# Patient Record
Sex: Male | Born: 1970 | Race: White | Hispanic: No | Marital: Married | Smoking: Never smoker
Health system: Southern US, Community
[De-identification: ages and names within clinical notes are randomized; demographics above are authoritative.]

## PROBLEM LIST (undated history)

## (undated) DIAGNOSIS — K219 Gastro-esophageal reflux disease without esophagitis: Secondary | ICD-10-CM

---

## 2006-11-03 ENCOUNTER — Emergency Department (HOSPITAL_COMMUNITY): Admission: EM | Admit: 2006-11-03 | Discharge: 2006-11-03 | Payer: Self-pay | Admitting: Emergency Medicine

## 2009-02-04 ENCOUNTER — Emergency Department: Payer: Self-pay | Admitting: Emergency Medicine

## 2010-08-14 IMAGING — CT CT STONE STUDY
1 of 3 series · 13 of 32 positions shown, 19 images · non-contrast
Comparison: none

REASON FOR EXAM: right flank pain
COMMENTS:

PROCEDURE:     CT  - CT ABDOMEN /PELVIS WO (STONE)  - February 04, 2009  [DATE]
RESULT:
HISTORY: RIGHT flank pain.

[Series 2: stone · axial · 0.61mm/px · z∈[-1046,-618]mm · 13 of 167 slices shown, 19 images]
[im 12/167  soft-tissue]
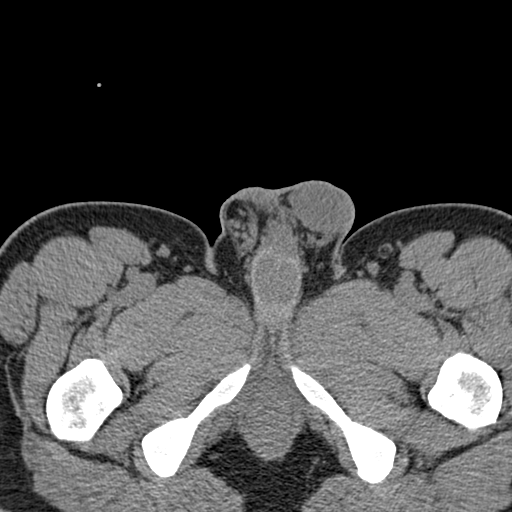
[im 12/167  bone]
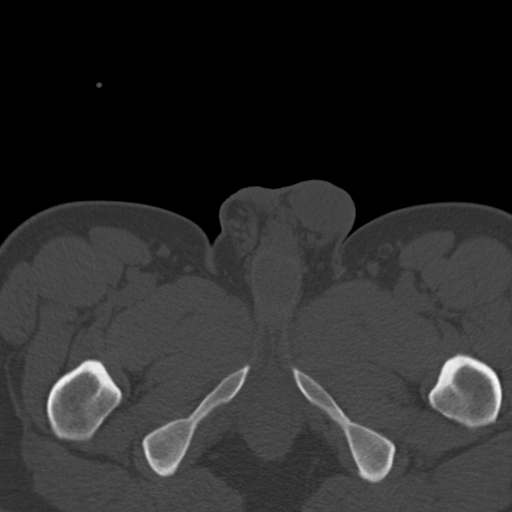
[im 24/167  soft-tissue]
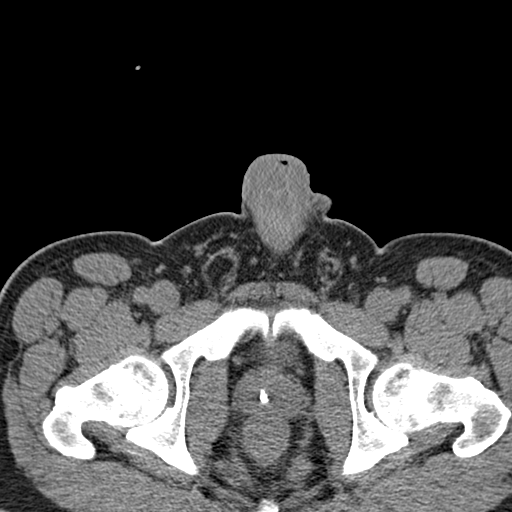
[im 36/167  soft-tissue]
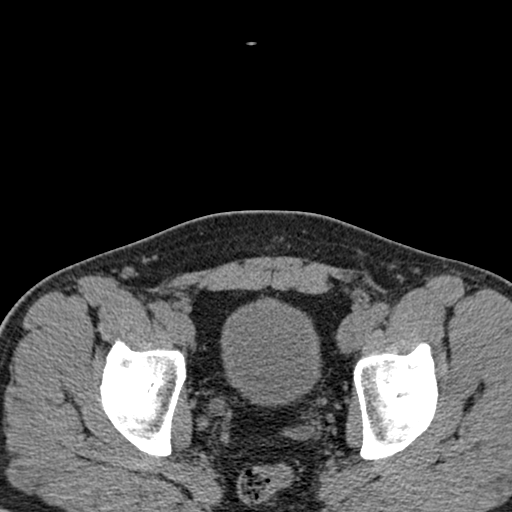
[im 48/167  soft-tissue]
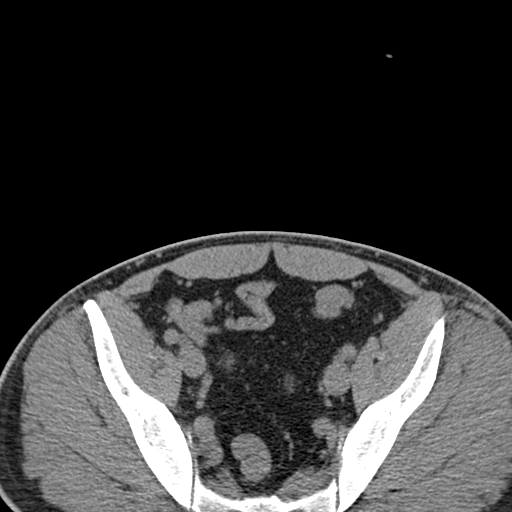
[im 60/167  soft-tissue]
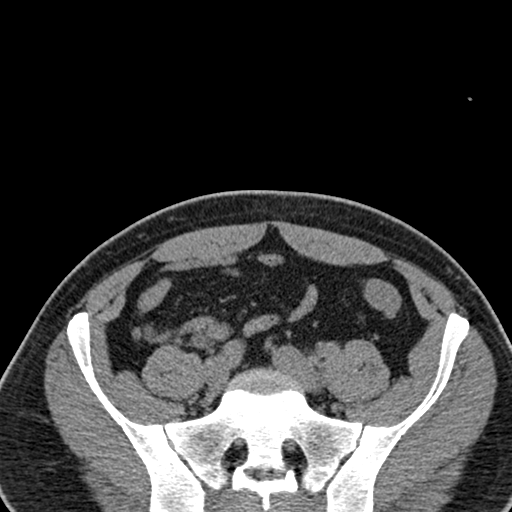
[im 72/167  soft-tissue]
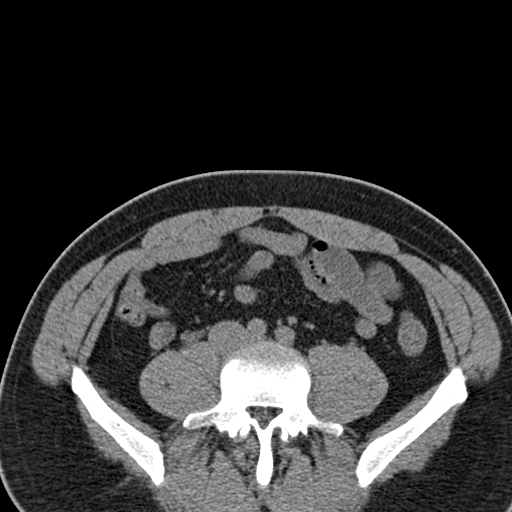
[im 84/167  soft-tissue]
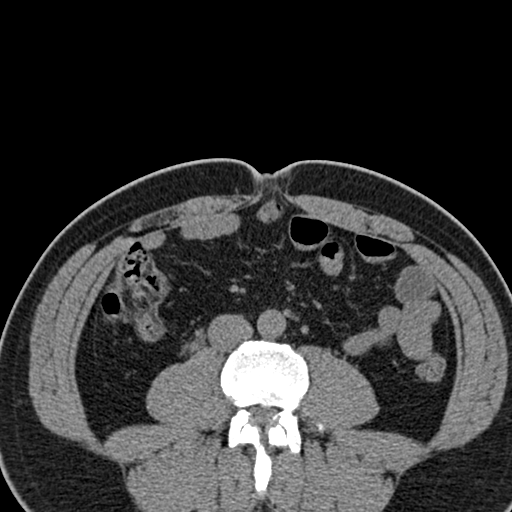
[im 95/167  soft-tissue]
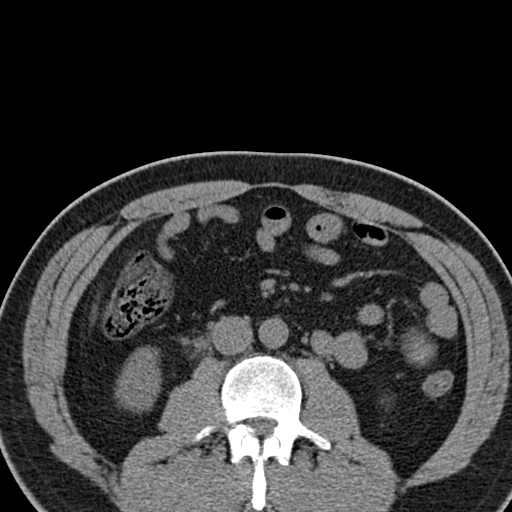
[im 107/167  soft-tissue]
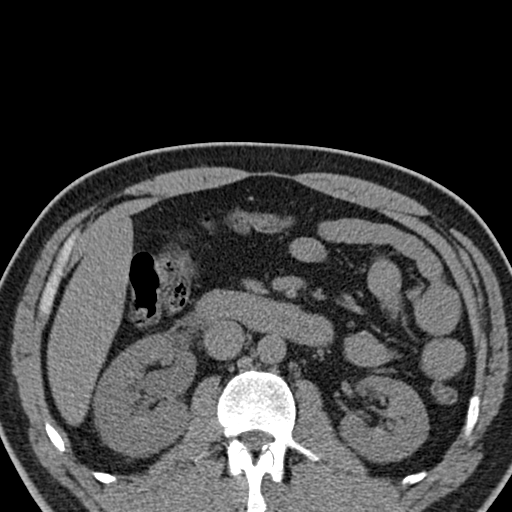
[im 107/167  bone]
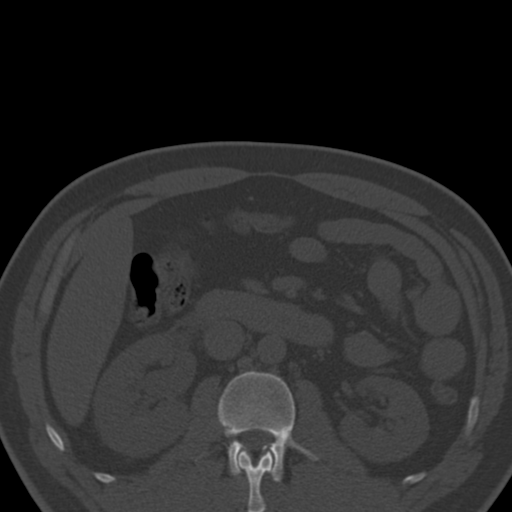
[im 119/167  soft-tissue]
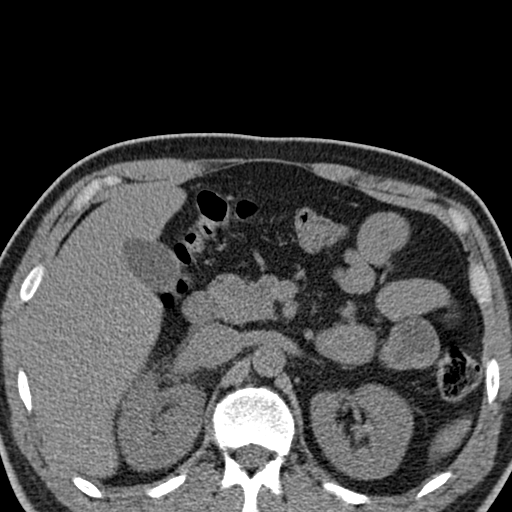
[im 119/167  lung]
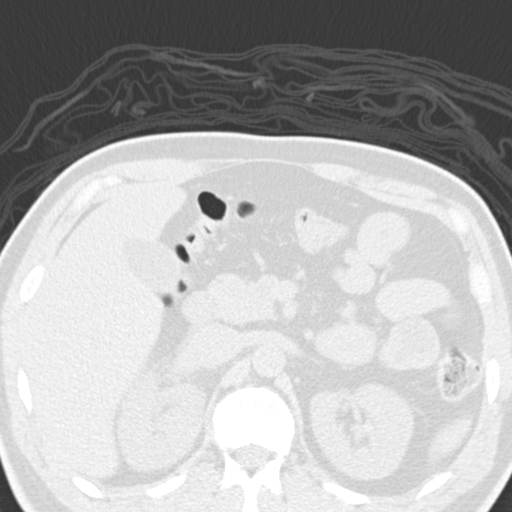
[im 131/167  soft-tissue]
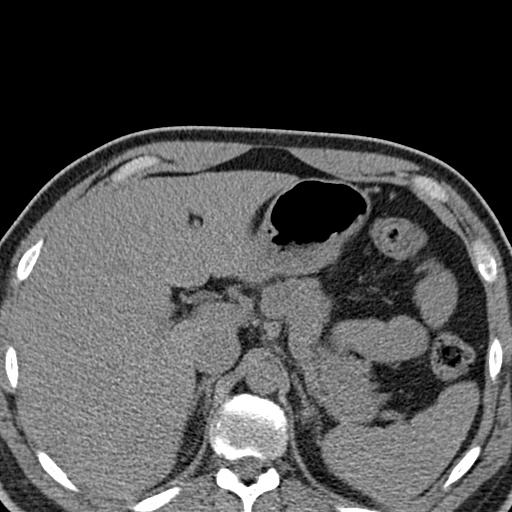
[im 131/167  lung]
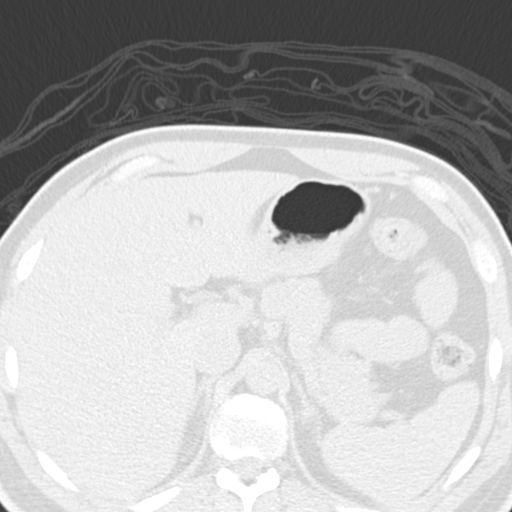
[im 143/167  soft-tissue]
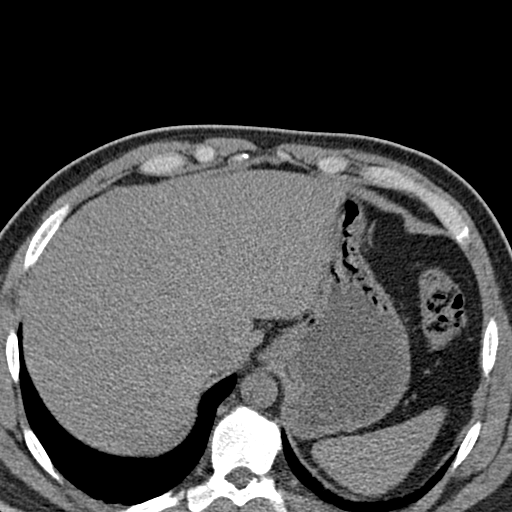
[im 143/167  lung]
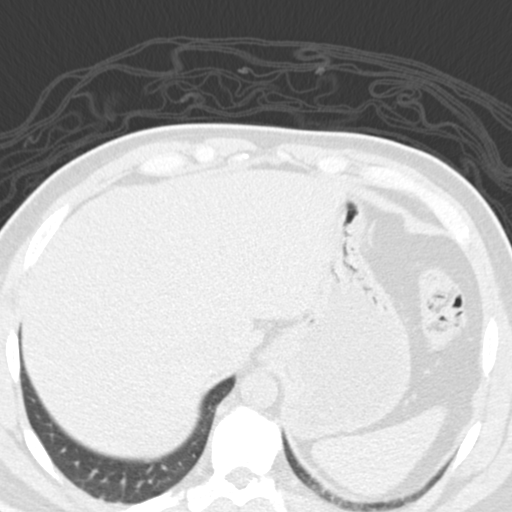
[im 155/167  soft-tissue]
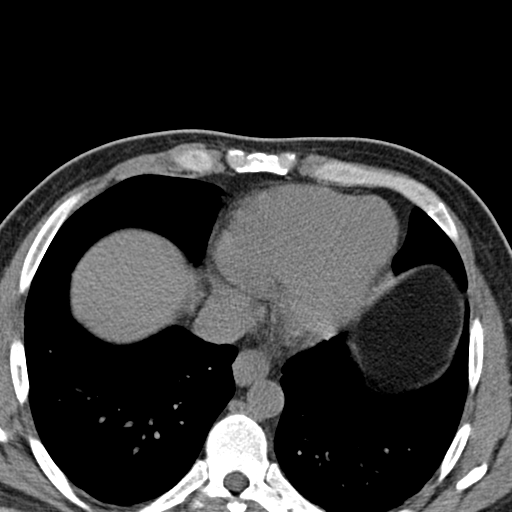
[im 155/167  lung]
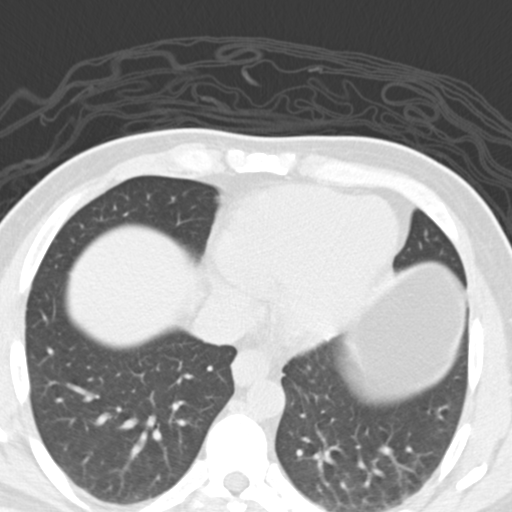

[13 of 32 positions shown; findings below may reference images not displayed]

COMPARISON STUDIES: No recent.

PROCEDURE AND FINDINGS: Non-enhanced CT of the abdomen and pelvis is
obtained. The liver and spleen are normal. The pancreas is normal. The
adrenals are normal. RIGHT nephrolithiasis is present.  There is an
approximately 4 to 5 mm stone in the distal RIGHT ureter with mild RIGHT
hydronephrosis and hydroureter. The bladder is unremarkable. The appendix is
normal. No bowel distention is noted.
IMPRESSION: 1. RIGHT nephrolithiasis with 4 to 5 mm stone in the distal RIGHT ureter
with RIGHT hydronephrosis and hydroureter.

This report was phoned to the Emergency Department physician at the time of
the study.

## 2010-10-11 IMAGING — CR DG ABDOMEN 1V
1 series · 1 of 1 positions shown · non-contrast
Comparison: none

REASON FOR EXAM: nephrolithiasis pt need films
COMMENTS:

[view not recorded]
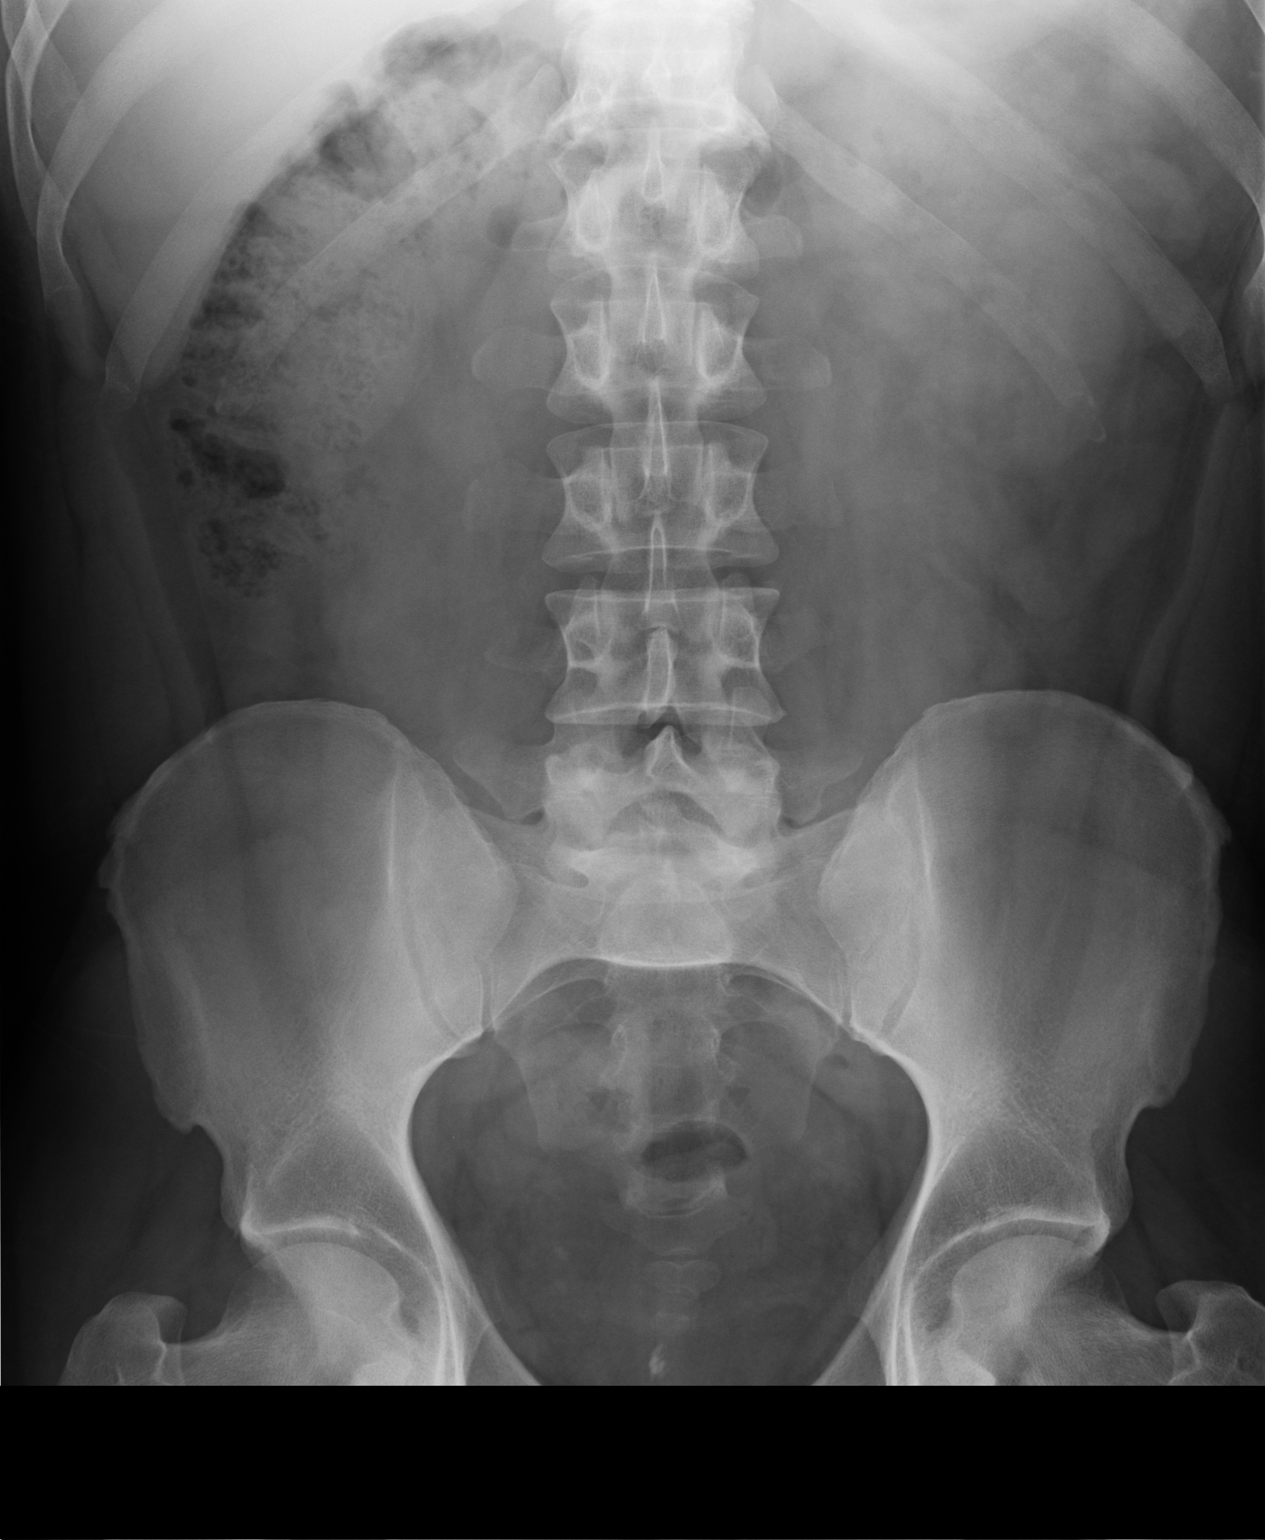

[1 of 1 positions shown; findings below may reference images not displayed]

PROCEDURE:     DXR - DXR KIDNEY URETER BLADDER  - April 03, 2009  [DATE]

RESULT:     There is a 4 mm calcification noted low in the right pelvis. The
etiology for this is not certain but the finding could represent a distal
right ureteral stone. No other calcification suspicious for renal or
ureteral stones is seen. There is a small amount of calcification noted
centrally in the region of the prostate gland.
IMPRESSION: 1. No renal calcifications are identified.
2. There is a faint density low in the right pelvis that could represent a
distal right ureteral stone.
3. There is a focus of calcification centrally in the region of the prostate.

## 2013-02-28 HISTORY — PX: HERNIA REPAIR: SHX51

## 2013-03-21 ENCOUNTER — Encounter: Payer: Self-pay | Admitting: General Surgery

## 2013-03-21 ENCOUNTER — Ambulatory Visit (INDEPENDENT_AMBULATORY_CARE_PROVIDER_SITE_OTHER): Payer: BC Managed Care – PPO | Admitting: General Surgery

## 2013-03-21 VITALS — BP 130/70 | HR 76 | Resp 12 | Ht 68.0 in | Wt 205.0 lb

## 2013-03-21 DIAGNOSIS — K429 Umbilical hernia without obstruction or gangrene: Secondary | ICD-10-CM

## 2013-03-21 NOTE — Patient Instructions (Addendum)
Hernia precautions and incarceration were discussed with the patient. If they develop symptoms of an incarcerated hernia, they were encouraged to seek prompt medical attention.  I have recommended repair of the hernia, using mesh if indicated, on an outpatient basis in the near future. The risk of infection was reviewed. The role of prosthetic mesh to minimize the risk of recurrence was reviewed.  Patient to check work schedule and will call the office when ready to arrange.

## 2013-03-21 NOTE — Progress Notes (Signed)
Patient ID: Matthew Giles, male   DOB: Jan 18, 1971, 42 y.o.   MRN: [id number]  Chief Complaint  Patient presents with  . Umbilical Hernia    HPI Matthew Giles is a 42 y.o. male here today for an evaluation of an umbilical hernia. Patient states it has been there for about four months. Was hurting him last week. HPI  History reviewed. No pertinent past medical history.  History reviewed. No pertinent past surgical history.  History reviewed. No pertinent family history.  Social History History  Substance Use Topics  . Smoking status: Not on file  . Smokeless tobacco: Never Used  . Alcohol Use: No    No Known Allergies  Current Outpatient Prescriptions  Medication Sig Dispense Refill  . omeprazole (PRILOSEC) 20 MG capsule Take 20 mg by mouth daily.       No current facility-administered medications for this visit.    Review of Systems Review of Systems  Constitutional: Negative.   Respiratory: Negative.   Cardiovascular: Positive for chest pain (off and on).    Blood pressure 130/70, pulse 76, resp. rate 12, height 5\' 8"  (1.727 m), weight 205 lb (92.987 kg).  Physical Exam Physical Exam  Constitutional: He appears well-developed and well-nourished.  Neck: Normal range of motion. Neck supple.  Cardiovascular: Normal rate, regular rhythm and normal heart sounds.   Pulmonary/Chest: Effort normal and breath sounds normal.  Abdominal: Soft. Bowel sounds are normal. A hernia (small umbilical hernia  5 mm ) is present.      Data Reviewed none  Assessment    Symptomatic small umbilical hernia     Plan    Recommend repair, l;ikely will not need use of mesh.      Patient to check work schedule and will call the office when ready to arrange.  [name] 03/22/2013, 1:26 PM

## 2013-03-22 ENCOUNTER — Encounter: Payer: Self-pay | Admitting: General Surgery

## 2013-03-22 ENCOUNTER — Telehealth: Payer: Self-pay | Admitting: *Deleted

## 2013-03-22 DIAGNOSIS — K429 Umbilical hernia without obstruction or gangrene: Secondary | ICD-10-CM | POA: Insufficient documentation

## 2013-03-22 NOTE — Telephone Encounter (Signed)
Patient called to arrange surgery. This has been scheduled for 03-24-13 at [facility]. He is aware of all instructions and will call if he has further questions.

## 2013-03-22 NOTE — Addendum Note (Signed)
Addended by: Kieth Brightly on: 03/22/2013 01:40 PM   Modules accepted: Orders

## 2013-03-24 ENCOUNTER — Ambulatory Visit: Payer: Self-pay | Admitting: General Surgery

## 2013-03-24 DIAGNOSIS — K429 Umbilical hernia without obstruction or gangrene: Secondary | ICD-10-CM

## 2013-04-04 ENCOUNTER — Encounter: Payer: Self-pay | Admitting: General Surgery

## 2013-04-04 ENCOUNTER — Ambulatory Visit (INDEPENDENT_AMBULATORY_CARE_PROVIDER_SITE_OTHER): Payer: BC Managed Care – PPO | Admitting: General Surgery

## 2013-04-04 VITALS — BP 104/74 | HR 70 | Resp 14 | Ht 68.0 in | Wt 208.0 lb

## 2013-04-04 DIAGNOSIS — K429 Umbilical hernia without obstruction or gangrene: Secondary | ICD-10-CM

## 2013-04-04 NOTE — Patient Instructions (Addendum)
Patient to return May 12 to work. 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

## 2013-04-04 NOTE — Progress Notes (Signed)
Subjective:     Patient ID: Matthew Giles, male   DOB: May 06, 1971, 42 y.o.   MRN: [id number]  HPI Patient here today for postop visit umbilical hernia done  March 24, 2013.   Still a little sore but states doing well. Denies nausea or vomiting.  Bowels are regular. No mesh was used-he had a 6 mm fascial defect.   Review of Systems  Constitutional: Negative.   Respiratory: Negative.   Cardiovascular: Negative.        Objective:   Physical Exam  Constitutional: He is oriented to person, place, and time. He appears well-developed and well-nourished.  Abdominal: Soft. Bowel sounds are normal.  Hernia site is nice and clean.  Neurological: He is alert and oriented to person, place, and time.       Assessment:     Doing well post op repair of small umbilical hernia     Plan:     May increase activity to normal next week.

## 2013-04-05 ENCOUNTER — Encounter: Payer: Self-pay | Admitting: General Surgery

## 2013-05-10 ENCOUNTER — Ambulatory Visit: Payer: BC Managed Care – PPO | Admitting: General Surgery

## 2013-05-24 ENCOUNTER — Encounter: Payer: Self-pay | Admitting: General Surgery

## 2013-05-24 ENCOUNTER — Ambulatory Visit (INDEPENDENT_AMBULATORY_CARE_PROVIDER_SITE_OTHER): Payer: BC Managed Care – PPO | Admitting: General Surgery

## 2013-05-24 VITALS — BP 110/78 | HR 68 | Resp 12 | Ht 68.0 in | Wt 203.0 lb

## 2013-05-24 DIAGNOSIS — K429 Umbilical hernia without obstruction or gangrene: Secondary | ICD-10-CM

## 2013-05-24 NOTE — Progress Notes (Signed)
Patient ID: Matthew Giles, male   DOB: [birth year], 42 y.o.   MRN: [id number]  Chief Complaint  Patient presents with  . Follow-up    hernia repair    HPI Matthew Giles is a 42 y.o. male who presents for a follow up post op umbilical hernia repair. The procedure was performed on 03/24/13. The patient states he is doing well. No complaints at this time.  HPI  History reviewed. No pertinent past medical history.  Past Surgical History  Procedure Laterality Date  . Hernia repair  April 2014    umbilical Dr Evette Cristal    History reviewed. No pertinent family history.  Social History History  Substance Use Topics  . Smoking status: Never Smoker   . Smokeless tobacco: Never Used  . Alcohol Use: No    No Known Allergies  No current outpatient prescriptions on file.   No current facility-administered medications for this visit.    Review of Systems Review of Systems  Constitutional: Negative.   Respiratory: Negative.   Cardiovascular: Negative.   Gastrointestinal: Negative.     Blood pressure 110/78, pulse 68, resp. rate 12, height 5\' 8"  (1.727 m), weight 203 lb (92.08 kg).  Physical Exam Physical ExamAbd is soft. Umbilical incision is well healed.  Data Reviewed none  Assessment   Umbilical hernia repair looks clean and well healed.    Plan    Patient to return as needed.        SANKAR,SEEPLAPUTHUR G 05/26/2013, 4:54 PM

## 2013-05-24 NOTE — Patient Instructions (Signed)
Patient to return as needed. 

## 2013-05-26 ENCOUNTER — Encounter: Payer: Self-pay | Admitting: General Surgery

## 2014-11-09 ENCOUNTER — Ambulatory Visit: Payer: Self-pay | Admitting: Family Medicine

## 2015-03-22 NOTE — Op Note (Signed)
PATIENT NAME:  Matthew Giles, Matthew Giles MR#:  [id number] DATE OF BIRTH:  30-Mar-1971  DATE OF PROCEDURE:  03/24/2013  PREOPERATIVE DIAGNOSIS: Umbilical hernia.   POSTOPERATIVE DIAGNOSIS: Umbilical hernia.   OPERATION: Repair of umbilical hernia.   SURGEON: Mckinley Jewel, MD   ANESTHESIA: Monitored care with local anesthetic of 0.5% Marcaine mixed with 1% Xylocaine.   COMPLICATIONS: None.   ESTIMATED BLOOD LOSS: Minimal.   DRAINS: None.   DESCRIPTION OF PROCEDURE: The patient was placed in the supine position on the operating table. With adequate sedation and monitoring, the umbilical area was prepped and draped out as a sterile field. Time out procedure was performed. Local anesthetic was then instilled along the upper lip of the umbilicus where the hernia was located and along the sides and inferior aspect to obtain an adequate block. A skin incision was made along the upper lip and deepened through the layers down to expose in the subcutaneous tissue a 2 cm fatty protrusion.  A fascial defect was identified which was less than a centimeter in size. The hernial protrusion was easily pushed back into the preperitoneal space, and the fascial edges were then approximated with 2 stitches of 0 Prolene with satisfactory closure of the defect. After ensuring hemostasis with cautery, 3-0 Vicryl was placed in the deeper tissue, and the skin was closed with subcuticular 4-0 Vicryl covered with Dermabond.       The procedure was well tolerated.  He was subsequently returned to the recovery room in stable condition. ____________________________ S.Robinette Haines, MD sgs:cb D: 03/26/2013 18:07:58 ET T: 03/26/2013 20:10:30 ET JOB#: [id number]  cc: Synthia Innocent. Jamal Collin, MD, <Dictator> [facility] Robinette Haines MD ELECTRONICALLY SIGNED 03/26/2013 21:01

## 2017-06-23 ENCOUNTER — Ambulatory Visit (INDEPENDENT_AMBULATORY_CARE_PROVIDER_SITE_OTHER): Payer: BLUE CROSS/BLUE SHIELD | Admitting: Family Medicine

## 2017-06-23 ENCOUNTER — Encounter: Payer: Self-pay | Admitting: Family Medicine

## 2017-06-23 VITALS — BP 100/60 | HR 77 | Temp 98.1°F | Resp 16 | Ht 68.0 in | Wt 206.0 lb

## 2017-06-23 DIAGNOSIS — Z0000 Encounter for general adult medical examination without abnormal findings: Secondary | ICD-10-CM | POA: Diagnosis not present

## 2017-06-23 DIAGNOSIS — Z6831 Body mass index (BMI) 31.0-31.9, adult: Secondary | ICD-10-CM

## 2017-06-23 NOTE — Progress Notes (Signed)
Patient: Matthew Giles, Male    DOB: 01-Jun-1971, 46 y.o.   MRN: [id number] Visit Date: 06/23/2017  Today's Provider: Lelon Huh, MD   Chief Complaint  Patient presents with  . Annual Exam  . Gastroesophageal Reflux   Subjective:    Annual physical exam Matthew Giles is a 46 y.o. male who presents today for health maintenance and complete physical. He feels well. He reports exercising no-work. He reports he is sleeping well.  ----------------------------------------------------------------   GERD From 11/09/2014-no changes. Well controlled on OTC PPI.   Review of Systems  Social History      He  reports that he has never smoked. He has never used smokeless tobacco. He reports that he does not drink alcohol or use drugs.       Social History   Social History  . Marital status: Married    Spouse name: N/A  . Number of children: N/A  . Years of education: N/A   Social History Main Topics  . Smoking status: Never Smoker  . Smokeless tobacco: Never Used  . Alcohol use No  . Drug use: No  . Sexual activity: Not Asked   Other Topics Concern  . None   Social History Narrative  . None    History reviewed. No pertinent past medical history.   Patient Active Problem List   Diagnosis Date Noted  . Umbilical hernia 12/45/8099    Past Surgical History:  Procedure Laterality Date  . HERNIA REPAIR  April 8338   umbilical Dr Jamal Collin    Family History        No family status information on file.        His family history is not on file.     No Known Allergies   Current Outpatient Prescriptions:  .  esomeprazole (NEXIUM 24HR) 20 MG capsule, Take 20 mg by mouth daily., Disp: , Rfl:    Patient Care Team: Birdie Sons, MD as PCP - General (Family Medicine) Christene Lye, MD (General Surgery)      Objective:   Vitals: BP 100/60 (BP Location: Right Arm, Patient Position: Sitting, Cuff Size: Large)   Pulse 77   Temp 98.1 F (36.7  C) (Oral)   Resp 16   Ht 5\' 8"  (1.727 m)   Wt 206 lb (93.4 kg)   SpO2 97%   BMI 31.32 kg/m    Vitals:   06/23/17 1514  BP: 100/60  Pulse: 77  Resp: 16  Temp: 98.1 F (36.7 C)  TempSrc: Oral  SpO2: 97%  Weight: 206 lb (93.4 kg)  Height: 5\' 8"  (1.727 m)    Wt Readings from Last 3 Encounters:  06/23/17 206 lb (93.4 kg)  05/24/13 203 lb (92.1 kg)  04/04/13 208 lb (94.3 kg)    Physical Exam   General Appearance:    Alert, cooperative, no distress, appears stated age  Head:    Normocephalic, without obvious abnormality, atraumatic  Eyes:    PERRL, conjunctiva/corneas clear, EOM's intact, fundi    benign, both eyes       Ears:    Normal TM's and external ear canals, both ears  Nose:   Nares normal, septum midline, mucosa normal, no drainage   or sinus tenderness  Throat:   Lips, mucosa, and tongue normal; teeth and gums normal  Neck:   Supple, symmetrical, trachea midline, no adenopathy;       thyroid:  No enlargement/tenderness/nodules; no carotid  bruit or JVD  Back:     Symmetric, no curvature, ROM normal, no CVA tenderness  Lungs:     Clear to auscultation bilaterally, respirations unlabored  Chest wall:    No tenderness or deformity  Heart:    Regular rate and rhythm, S1 and S2 normal, no murmur, rub   or gallop  Abdomen:     Soft, non-tender, bowel sounds active all four quadrants,    no masses, no organomegaly  Genitalia:    deferred  Rectal:    deferred  Extremities:   Extremities normal, atraumatic, no cyanosis or edema  Pulses:   2+ and symmetric all extremities  Skin:   Skin color, texture, turgor normal, no rashes or lesions  Lymph nodes:   Cervical, supraclavicular, and axillary nodes normal  Neurologic:   CNII-XII intact. Normal strength, sensation and reflexes      throughout    Depression Screen PHQ 2/9 Scores 06/23/2017  PHQ - 2 Score 0  PHQ- 9 Score 0      Assessment & Plan:     Routine Health Maintenance and Physical Exam  Exercise  Activities and Dietary recommendations Goals    None      Immunization History  Administered Date(s) Administered  . Tdap 04/05/2013    Health Maintenance  Topic Date Due  . HIV Screening  01/06/1986  . INFLUENZA VACCINE  06/30/2017  . TETANUS/TDAP  04/06/2023     Discussed health benefits of physical activity, and encouraged him to engage in regular exercise appropriate for his age and condition.    -------------------------------------------------------------------- 1. Annual physical exam  - Comprehensive metabolic panel - Lipid panel  2. BMI 31.0-31.9,adult Discussed diet and exercise.     Lelon Huh, MD  [facility]

## 2017-06-23 NOTE — Patient Instructions (Addendum)
It is recommended to engage in 150 minutes of moderate exercise every week.     Preventive Care 40-64 Years, Male Preventive care refers to lifestyle choices and visits with your health care provider that can promote health and wellness. What does preventive care include?  A yearly physical exam. This is also called an annual well check.  Dental exams once or twice a year.  Routine eye exams. Ask your health care provider how often you should have your eyes checked.  Personal lifestyle choices, including: ? Daily care of your teeth and gums. ? Regular physical activity. ? Eating a healthy diet. ? Avoiding tobacco and drug use. ? Limiting alcohol use. ? Practicing safe sex. ? Taking low-dose aspirin every day starting at age 19. What happens during an annual well check? The services and screenings done by your health care provider during your annual well check will depend on your age, overall health, lifestyle risk factors, and family history of disease. Counseling Your health care provider may ask you questions about your:  Alcohol use.  Tobacco use.  Drug use.  Emotional well-being.  Home and relationship well-being.  Sexual activity.  Eating habits.  Work and work Statistician.  Screening You may have the following tests or measurements:  Height, weight, and BMI.  Blood pressure.  Lipid and cholesterol levels. These may be checked every 5 years, or more frequently if you are over 39 years old.  Skin check.  Lung cancer screening. You may have this screening every year starting at age 60 if you have a 30-pack-year history of smoking and currently smoke or have quit within the past 15 years.  Fecal occult blood test (FOBT) of the stool. You may have this test every year starting at age 70.  Flexible sigmoidoscopy or colonoscopy. You may have a sigmoidoscopy every 5 years or a colonoscopy every 10 years starting at age 40.  Prostate cancer screening.  Recommendations will vary depending on your family history and other risks.  Hepatitis C blood test.  Hepatitis B blood test.  Sexually transmitted disease (STD) testing.  Diabetes screening. This is done by checking your blood sugar (glucose) after you have not eaten for a while (fasting). You may have this done every 1-3 years.  Discuss your test results, treatment options, and if necessary, the need for more tests with your health care provider. Vaccines Your health care provider may recommend certain vaccines, such as:  Influenza vaccine. This is recommended every year.  Tetanus, diphtheria, and acellular pertussis (Tdap, Td) vaccine. You may need a Td booster every 10 years.  Varicella vaccine. You may need this if you have not been vaccinated.  Zoster vaccine. You may need this after age 84.  Measles, mumps, and rubella (MMR) vaccine. You may need at least one dose of MMR if you were born in 1957 or later. You may also need a second dose.  Pneumococcal 13-valent conjugate (PCV13) vaccine. You may need this if you have certain conditions and have not been vaccinated.  Pneumococcal polysaccharide (PPSV23) vaccine. You may need one or two doses if you smoke cigarettes or if you have certain conditions.  Meningococcal vaccine. You may need this if you have certain conditions.  Hepatitis A vaccine. You may need this if you have certain conditions or if you travel or work in places where you may be exposed to hepatitis A.  Hepatitis B vaccine. You may need this if you have certain conditions or if you travel or work  in places where you may be exposed to hepatitis B.  Haemophilus influenzae type b (Hib) vaccine. You may need this if you have certain risk factors.  Talk to your health care provider about which screenings and vaccines you need and how often you need them. This information is not intended to replace advice given to you by your health care provider. Make sure you  discuss any questions you have with your health care provider. Document Released: 12/13/2015 Document Revised: 08/05/2016 Document Reviewed: 09/17/2015 Elsevier Interactive Patient Education  2017 [employer].

## 2017-06-24 LAB — COMPREHENSIVE METABOLIC PANEL
A/G RATIO: 1.7 (ref 1.2–2.2)
ALT: 26 IU/L (ref 0–44)
AST: 22 IU/L (ref 0–40)
Albumin: 4.7 g/dL (ref 3.5–5.5)
Alkaline Phosphatase: 75 IU/L (ref 39–117)
BILIRUBIN TOTAL: 0.4 mg/dL (ref 0.0–1.2)
BUN/Creatinine Ratio: 24 — ABNORMAL HIGH (ref 9–20)
BUN: 25 mg/dL — ABNORMAL HIGH (ref 6–24)
CALCIUM: 9.3 mg/dL (ref 8.7–10.2)
CHLORIDE: 101 mmol/L (ref 96–106)
CO2: 25 mmol/L (ref 20–29)
Creatinine, Ser: 1.06 mg/dL (ref 0.76–1.27)
GFR, EST AFRICAN AMERICAN: 97 mL/min/{1.73_m2} (ref >59)
GFR, EST NON AFRICAN AMERICAN: 84 mL/min/{1.73_m2} (ref >59)
GLOBULIN, TOTAL: 2.7 g/dL (ref 1.5–4.5)
Glucose: 89 mg/dL (ref 65–99)
POTASSIUM: 4.2 mmol/L (ref 3.5–5.2)
Sodium: 140 mmol/L (ref 134–144)
Total Protein: 7.4 g/dL (ref 6.0–8.5)

## 2017-06-24 LAB — LIPID PANEL
CHOL/HDL RATIO: 3.1 ratio (ref 0.0–5.0)
Cholesterol, Total: 163 mg/dL (ref 100–199)
HDL: 53 mg/dL (ref >39)
LDL Calculated: 85 mg/dL (ref 0–99)
TRIGLYCERIDES: 123 mg/dL (ref 0–149)
VLDL Cholesterol Cal: 25 mg/dL (ref 5–40)

## 2018-02-23 DIAGNOSIS — H5213 Myopia, bilateral: Secondary | ICD-10-CM | POA: Diagnosis not present

## 2018-02-23 DIAGNOSIS — D3132 Benign neoplasm of left choroid: Secondary | ICD-10-CM | POA: Diagnosis not present

## 2018-06-28 NOTE — Progress Notes (Signed)
Patient: Matthew Giles, Male    DOB: 06-02-1971, 47 y.o.   MRN: [id number] Visit Date: 06/29/2018  Today's Provider: Lelon Huh, MD   Chief Complaint  Patient presents with  . Annual Exam   Subjective:    Annual physical exam Matthew Giles is a 47 y.o. male who presents today for health maintenance and complete physical. He feels fairly well. He reports exercising no regular exercise, but does have strenuous job. He reports he is sleeping poorly. ---------------------------------------------------------------  Wt Readings from Last 3 Encounters:  06/29/18 205 lb (93 kg)  06/23/17 206 lb (93.4 kg)  05/24/13 203 lb (92.1 kg)     Review of Systems  Constitutional: Negative for chills, diaphoresis and fever.  HENT: Negative for congestion, ear discharge, ear pain, hearing loss, nosebleeds, sore throat and tinnitus.   Eyes: Negative for photophobia, pain, discharge and redness.  Respiratory: Negative for cough, shortness of breath, wheezing and stridor.   Cardiovascular: Negative for chest pain, palpitations and leg swelling.  Gastrointestinal: Negative for abdominal pain, blood in stool, constipation, diarrhea, nausea and vomiting.  Endocrine: Negative for polydipsia.  Genitourinary: Negative for dysuria, flank pain, frequency, hematuria and urgency.  Musculoskeletal: Negative for back pain, myalgias and neck pain.  Skin: Negative for rash.  Allergic/Immunologic: Negative for environmental allergies.  Neurological: Negative for dizziness, tremors, seizures, weakness and headaches.  Hematological: Does not bruise/bleed easily.  Psychiatric/Behavioral: Negative for hallucinations and suicidal ideas. The patient is not nervous/anxious.     Social History      He  reports that he has never smoked. He has never used smokeless tobacco. He reports that he does not drink alcohol or use drugs.       Social History   Socioeconomic History  . Marital status: Married   Spouse name: Not on file  . Number of children: Not on file  . Years of education: Not on file  . Highest education level: Not on file  Occupational History  . Occupation: Press photographer  Social Needs  . Financial resource strain: Not on file  . Food insecurity:    Worry: Not on file    Inability: Not on file  . Transportation needs:    Medical: Not on file    Non-medical: Not on file  Tobacco Use  . Smoking status: Never Smoker  . Smokeless tobacco: Never Used  Substance and Sexual Activity  . Alcohol use: No  . Drug use: No  . Sexual activity: Not on file  Lifestyle  . Physical activity:    Days per week: Not on file    Minutes per session: Not on file  . Stress: Not on file  Relationships  . Social connections:    Talks on phone: Not on file    Gets together: Not on file    Attends religious service: Not on file    Active member of club or organization: Not on file    Attends meetings of clubs or organizations: Not on file    Relationship status: Not on file  Other Topics Concern  . Not on file  Social History Narrative  . Not on file    History reviewed. No pertinent past medical history.   Patient Active Problem List   Diagnosis Date Noted  . Acid reflux 06/29/2018  . Calculus of kidney 06/29/2018  . BMI 31.0-31.9,adult 06/23/2017  . Umbilical hernia 43/15/4008    Past Surgical History:  Procedure Laterality Date  . HERNIA REPAIR  [facility]   umbilical Dr Jamal Collin    Family History        Family Status  Relation Name Status  . Mother  (Not Specified)  . Father  (Not Specified)        His family history includes Cancer in his father; Diabetes in his mother.      No Known Allergies   Current Outpatient Medications:  .  esomeprazole (NEXIUM 24HR) 20 MG capsule, Take 20 mg by mouth daily., Disp: , Rfl:    Patient Care Team: Birdie Sons, MD as PCP - General (Family Medicine) Christene Lye, MD (General Surgery)      Objective:    Vitals: BP 128/83 (BP Location: Right Arm, Patient Position: Sitting, Cuff Size: Large)   Pulse 87   Temp 98 F (36.7 C) (Oral)   Resp 16   Ht 5\' 8"  (1.727 m)   Wt 205 lb (93 kg)   SpO2 97%   BMI 31.17 kg/m    Vitals:   06/29/18 1403  BP: 128/83  Pulse: 87  Resp: 16  Temp: 98 F (36.7 C)  TempSrc: Oral  SpO2: 97%  Weight: 205 lb (93 kg)  Height: 5\' 8"  (1.727 m)     Physical Exam   General Appearance:    Alert, cooperative, no distress, appears stated age  Head:    Normocephalic, without obvious abnormality, atraumatic  Eyes:    PERRL, conjunctiva/corneas clear, EOM's intact, fundi    benign, both eyes       Ears:    Normal TM's and external ear canals, both ears  Nose:   Nares normal, septum midline, mucosa normal, no drainage   or sinus tenderness  Throat:   Lips, mucosa, and tongue normal; teeth and gums normal  Neck:   Supple, symmetrical, trachea midline, no adenopathy;       thyroid:  No enlargement/tenderness/nodules; no carotid   bruit or JVD  Back:     Symmetric, no curvature, ROM normal, no CVA tenderness  Lungs:     Clear to auscultation bilaterally, respirations unlabored  Chest wall:    No tenderness or deformity  Heart:    Regular rate and rhythm, S1 and S2 normal, no murmur, rub   or gallop  Abdomen:     Soft, non-tender, bowel sounds active all four quadrants,    no masses, no organomegaly  Genitalia:    deferred  Rectal:    deferred  Extremities:   Extremities normal, atraumatic, no cyanosis or edema  Pulses:   2+ and symmetric all extremities  Skin:   Skin color, texture, turgor normal, no rashes or lesions  Lymph nodes:   Cervical, supraclavicular, and axillary nodes normal  Neurologic:   CNII-XII intact. Normal strength, sensation and reflexes      throughout    Depression Screen PHQ 2/9 Scores 06/29/2018 06/23/2017  PHQ - 2 Score 0 0  PHQ- 9 Score 1 0    Results for orders placed or performed in visit on 06/29/18  POCT glucose  (manual entry)  Result Value Ref Range   POC Glucose 99 70 - 99 mg/dl     Assessment & Plan:     Routine Health Maintenance and Physical Exam  Exercise Activities and Dietary recommendations Goals    None      Immunization History  Administered Date(s) Administered  . Tdap 04/05/2013    Health Maintenance  Topic Date Due  . HIV Screening  01/06/1986  . INFLUENZA  VACCINE  06/30/2018  . TETANUS/TDAP  04/06/2023     Discussed health benefits of physical activity, and encouraged him to engage in regular exercise appropriate for his age and condition.    --------------------------------------------------------------------    Lelon Huh, MD  [place]

## 2018-06-29 ENCOUNTER — Ambulatory Visit (INDEPENDENT_AMBULATORY_CARE_PROVIDER_SITE_OTHER): Payer: BLUE CROSS/BLUE SHIELD | Admitting: Family Medicine

## 2018-06-29 ENCOUNTER — Encounter: Payer: Self-pay | Admitting: Family Medicine

## 2018-06-29 VITALS — BP 128/83 | HR 87 | Temp 98.0°F | Resp 16 | Ht 68.0 in | Wt 205.0 lb

## 2018-06-29 DIAGNOSIS — Z0000 Encounter for general adult medical examination without abnormal findings: Secondary | ICD-10-CM | POA: Diagnosis not present

## 2018-06-29 DIAGNOSIS — N200 Calculus of kidney: Secondary | ICD-10-CM | POA: Insufficient documentation

## 2018-06-29 DIAGNOSIS — Z131 Encounter for screening for diabetes mellitus: Secondary | ICD-10-CM

## 2018-06-29 DIAGNOSIS — K219 Gastro-esophageal reflux disease without esophagitis: Secondary | ICD-10-CM | POA: Insufficient documentation

## 2018-06-29 LAB — GLUCOSE, POCT (MANUAL RESULT ENTRY): POC GLUCOSE: 99 mg/dL (ref 70–99)

## 2018-06-29 NOTE — Patient Instructions (Signed)

## 2019-09-04 ENCOUNTER — Ambulatory Visit (INDEPENDENT_AMBULATORY_CARE_PROVIDER_SITE_OTHER): Payer: BC Managed Care – PPO | Admitting: Family Medicine

## 2019-09-04 ENCOUNTER — Encounter: Payer: Self-pay | Admitting: Family Medicine

## 2019-09-04 ENCOUNTER — Other Ambulatory Visit: Payer: Self-pay

## 2019-09-04 VITALS — BP 116/74 | HR 64 | Temp 96.2°F | Resp 16 | Wt 221.8 lb

## 2019-09-04 DIAGNOSIS — Z0000 Encounter for general adult medical examination without abnormal findings: Secondary | ICD-10-CM | POA: Diagnosis not present

## 2019-09-04 NOTE — Progress Notes (Signed)
Patient: Matthew Giles, Male    DOB: [birth year], 48 y.o.   MRN: [id number] Visit Date: 09/04/2019  Today's Provider: Lelon Huh, MD   Chief Complaint  Patient presents with  . Annual Exam   Subjective:    Annual physical exam Matthew Giles is a 48 y.o. male who presents today for health maintenance and complete physical. He feels well. He reports  he is not actively exercising . He reports he is sleeping fairly well on average 4-6hrs of sleep a night.  -----------------------------------------------------------------   Review of Systems  Constitutional: Negative for chills, diaphoresis and fever.  HENT: Negative for congestion, ear discharge, ear pain, hearing loss, nosebleeds, sore throat and tinnitus.   Eyes: Negative for photophobia, pain, discharge and redness.  Respiratory: Negative for cough, shortness of breath, wheezing and stridor.   Cardiovascular: Negative for chest pain, palpitations and leg swelling.  Gastrointestinal: Negative for abdominal pain, blood in stool, constipation, diarrhea, nausea and vomiting.  Endocrine: Negative for polydipsia.  Genitourinary: Negative for dysuria, flank pain, frequency, hematuria and urgency.  Musculoskeletal: Negative for back pain, myalgias and neck pain.  Skin: Negative for rash.  Allergic/Immunologic: Negative for environmental allergies.  Neurological: Negative for dizziness, tremors, seizures, weakness and headaches.  Hematological: Does not bruise/bleed easily.  Psychiatric/Behavioral: Negative for hallucinations and suicidal ideas. The patient is not nervous/anxious.   Social History He  reports that he has never smoked. He has never used smokeless tobacco. He reports that he does not drink alcohol or use drugs. Social History   Socioeconomic History  . Marital status: Married    Spouse name: Not on file  . Number of children: 2  . Years of education: Not on file  . Highest education level: Not on file   Occupational History  . Occupation: Press photographer  Social Needs  . Financial resource strain: Not on file  . Food insecurity    Worry: Not on file    Inability: Not on file  . Transportation needs    Medical: Not on file    Non-medical: Not on file  Tobacco Use  . Smoking status: Never Smoker  . Smokeless tobacco: Never Used  Substance and Sexual Activity  . Alcohol use: No  . Drug use: No  . Sexual activity: Not on file  Lifestyle  . Physical activity    Days per week: Not on file    Minutes per session: Not on file  . Stress: Not on file  Relationships  . Social Herbalist on phone: Not on file    Gets together: Not on file    Attends religious service: Not on file    Active member of club or organization: Not on file    Attends meetings of clubs or organizations: Not on file    Relationship status: Not on file  Other Topics Concern  . Not on file  Social History Narrative   Has two sons and a step son    Patient Active Problem List   Diagnosis Date Noted  . Acid reflux 06/29/2018  . Calculus of kidney 06/29/2018  . BMI 31.0-31.9,adult 06/23/2017  . Umbilical hernia [id number]    Past Surgical History:  Procedure Laterality Date  . HERNIA REPAIR  April [id number]   umbilical Dr Jamal Collin    Family History  Family Status  Relation Name Status  . Mother  (Not Specified)  . Father  (Not Specified)   His family history includes  Cancer in his father; Diabetes in his mother.     No Known Allergies  Previous Medications   ESOMEPRAZOLE (NEXIUM 24HR) 20 MG CAPSULE    Take 20 mg by mouth daily.    Patient Care Team: Birdie Sons, MD as PCP - General (Family Medicine) Christene Lye, MD (General Surgery)      Objective:   Vitals: BP 116/74   Pulse 64   Temp (!) 96.2 F (35.7 C) (Oral)   Resp 16   Wt 221 lb 12.8 oz (100.6 kg)   BMI 33.72 kg/m    Physical Exam   General Appearance:    Obese male. Alert, cooperative, in no acute distress,  appears stated age  Head:    Normocephalic, without obvious abnormality, atraumatic  Eyes:    PERRL, conjunctiva/corneas clear, EOM's intact, fundi    benign, both eyes       Ears:    Normal TM's and external ear canals, both ears  Nose:   Nares normal, septum midline, mucosa normal, no drainage   or sinus tenderness  Throat:   Lips, mucosa, and tongue normal; teeth and gums normal  Neck:   Supple, symmetrical, trachea midline, no adenopathy;       thyroid:  No enlargement/tenderness/nodules; no carotid   bruit or JVD  Back:     Symmetric, no curvature, ROM normal, no CVA tenderness  Lungs:     Clear to auscultation bilaterally, respirations unlabored  Chest wall:    No tenderness or deformity  Heart:    Normal heart rate. Normal rhythm. No murmurs, rubs, or gallops.  S1 and S2 normal  Abdomen:     Soft, non-tender, bowel sounds active all four quadrants,    no masses, no organomegaly  Genitalia:    deferred  Rectal:    deferred  Extremities:   All extremities are intact. No cyanosis or edema  Pulses:   2+ and symmetric all extremities  Skin:   Skin color, texture, turgor normal, no rashes or lesions  Lymph nodes:   Cervical, supraclavicular, and axillary nodes normal  Neurologic:   CNII-XII intact. Normal strength, sensation and reflexes      throughout    Depression Screen PHQ 2/9 Scores 06/29/2018 06/23/2017  PHQ - 2 Score 0 0  PHQ- 9 Score 1 0      Assessment & Plan:     Routine Health Maintenance and Physical Exam  Exercise Activities and Dietary recommendations Goals   None     Immunization History  Administered Date(s) Administered  . Tdap 04/05/2013    Health Maintenance  Topic Date Due  . HIV Screening  01/06/1986  . INFLUENZA VACCINE  02/28/2020 (Originally 07/01/2019)  . TETANUS/TDAP  04/06/2023     Discussed health benefits of physical activity, and encouraged him to engage in regular exercise appropriate for his age and condition.     --------------------------------------------------------------------

## 2019-09-04 NOTE — Patient Instructions (Addendum)
.   Please review the attached list of medications and notify my office if there are any errors.   . Please bring all of your medications to every appointment so we can make sure that our medication list is the same as yours.   . It is especially important to get the annual flu vaccine this year. If you haven't had it already, please go to your pharmacy or call the office as soon as possible to schedule you flu shot.   You need to engage in moderate exercise such as brisk walking for an average of 30 minutes per day. Reduce your calory intake by eating smaller meals and not getting second helpings. Avoid high glycemic index foods with a goal of losing 4-5 pounds per month to achieve and maintain a weight below 200 within the next 6 months.

## 2019-12-25 DIAGNOSIS — Z20828 Contact with and (suspected) exposure to other viral communicable diseases: Secondary | ICD-10-CM | POA: Diagnosis not present

## 2020-05-20 DIAGNOSIS — Z20822 Contact with and (suspected) exposure to covid-19: Secondary | ICD-10-CM | POA: Diagnosis not present

## 2020-07-27 DIAGNOSIS — Z20822 Contact with and (suspected) exposure to covid-19: Secondary | ICD-10-CM | POA: Diagnosis not present

## 2020-10-22 ENCOUNTER — Other Ambulatory Visit: Payer: Self-pay

## 2020-10-22 ENCOUNTER — Encounter: Payer: Self-pay | Admitting: Physician Assistant

## 2020-10-22 ENCOUNTER — Ambulatory Visit (INDEPENDENT_AMBULATORY_CARE_PROVIDER_SITE_OTHER): Payer: BC Managed Care – PPO | Admitting: Physician Assistant

## 2020-10-22 VITALS — BP 124/73 | HR 76 | Temp 98.3°F | Wt 218.5 lb

## 2020-10-22 DIAGNOSIS — L237 Allergic contact dermatitis due to plants, except food: Secondary | ICD-10-CM | POA: Diagnosis not present

## 2020-10-22 MED ORDER — PREDNISONE 10 MG PO TABS
ORAL_TABLET | ORAL | 0 refills | Status: DC
Start: 2020-10-22 — End: 2022-01-16

## 2020-10-22 NOTE — Progress Notes (Signed)
     Established patient visit   Patient: Matthew Giles   DOB: 06-28-1971   49 y.o. Male  MRN: [id number] Visit Date: 10/22/2020  Today's healthcare provider: Trinna Post, PA-C   Chief Complaint  Patient presents with  . Rash  I,Porsha C McClurkin,acting as a scribe for Trinna Post, PA-C.,have documented all relevant documentation on the behalf of Trinna Post, PA-C,as directed by  Trinna Post, PA-C while in the presence of Trinna Post, PA-C.  Subjective    Rash This is a new problem. The current episode started 1 to 4 weeks ago. The problem is unchanged. The affected locations include the neck, left arm, left elbow, left lower leg and left upper leg. The rash is characterized by dryness, itchiness and redness. He was exposed to plant contact. Pertinent negatives include no diarrhea, facial edema, fatigue, joint pain, rhinorrhea, shortness of breath, sore throat or vomiting. Past treatments include nothing.    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 He reports using some calamine lotion with minimal relief.       Medications: Outpatient Medications Prior to Visit  Medication Sig  . esomeprazole (NEXIUM 24HR) 20 MG capsule Take 20 mg by mouth daily.   No facility-administered medications prior to visit.    Review of Systems  Constitutional: Negative for fatigue.  HENT: Negative for rhinorrhea and sore throat.   Respiratory: Negative for shortness of breath.   Gastrointestinal: Negative for diarrhea and vomiting.  Musculoskeletal: Negative for joint pain.  Skin: Positive for rash.      Objective    BP 124/73 (BP Location: Left Arm, Patient Position: Sitting, Cuff Size: Large)   Pulse 76   Temp 98.3 F (36.8 C) (Oral)   Wt 218 lb 8 oz (99.1 kg)   SpO2 100%   BMI 33.22 kg/m    Physical Exam Constitutional:      Appearance: Normal appearance.  Cardiovascular:     Rate and Rhythm: Normal rate.  Pulmonary:     Effort:  Pulmonary effort is normal.  Skin:    General: Skin is warm and dry.     Findings: Rash present.       Neurological:     Mental Status: He is alert and oriented to person, place, and time. Mental status is at baseline.  Psychiatric:        Mood and Affect: Mood normal.        Behavior: Behavior normal.       No results found for any visits on 10/22/20.  Assessment & Plan    1. Poison oak dermatitis  - predniSONE (DELTASONE) 10 MG tablet; Take 6 pills on day 1, 5 pills on day 2 and so on until complete.  Dispense: 21 tablet; Refill: 0   Return if symptoms worsen or fail to improve.      I, Porsha C McClurkin, CMA, have reviewed all documentation for this visit. The documentation on 10/22/20 for the exam, diagnosis, procedures, and orders are all accurate and complete.  The entirety of the information documented in the History of Present Illness, Review of Systems and Physical Exam were personally obtained by me. Portions of this information were initially documented by [employer] and reviewed by me for thoroughness and accuracy.     Paulene Floor  [facility] [phone] (phone) [phone] (fax)  [place]

## 2020-10-22 NOTE — Patient Instructions (Signed)
Poison Oak Dermatitis  Poison oak dermatitis is redness and soreness (inflammation) of the skin caused by chemicals in the leaves of the poison oak plant. You may have very bad itching, swelling, a rash, and blisters. What are the causes? You may get this condition by:  Touching a poison oak plant.  Touching something that has the chemical from the leaves on it. This may include animals or objects that have come in contact with the plant. What increases the risk? You are more likely to get this condition if you:  Go outdoors often in wooded or marshy areas.  Go outdoors without wearing protective clothing, such as closed shoes, long pants, and a long-sleeved shirt. What are the signs or symptoms? Symptoms of this condition include:  Redness of the skin.  Very bad itching.  A rash that often includes bumps and blisters. ? The rash usually appears 48 hours after exposure if you have been exposed before. ? If this is the first time you have been exposed, the rash may not appear until a week after exposure.  Swelling. This may occur if the reaction is very bad. Symptoms often clear up in 1-2 weeks. The first time you develop this condition, symptoms may last 3-4 weeks. How is this treated? This condition may be treated with:  Hydrocortisone creams or calamine lotions to help with itching.  Oatmeal baths to soothe the skin.  Medicines to help reduce itching (antihistamines). If you have a very bad reaction, you may also be given steroid medicines. Follow these instructions at home: Medicines  Take or apply over-the-counter and prescription medicines only as told by your doctor.  Use hydrocortisone creams or calamine lotion as needed to help with itching. General instructions  Do not scratch or rub your skin.  Put a cold, wet cloth (cold compress) on the affected areas or take baths in cool water. This will help with itching.  Avoid hot baths and showers.  Take oatmeal  baths as needed. Use colloidal oatmeal. You can get this at a pharmacy or grocery store. Follow the instructions on the package.  While you have the rash, wash your clothes right after you wear them.  Keep all follow-up visits as told by your doctor. This is important. How is this prevented?   Know what poison oak looks like so you can avoid it. ? This plant has three leaves with flowering branches on a single stem. ? The leaves are fuzzy. ? The edges of the leaves look like teeth.  If you have touched poison oak, wash your skin with soap and water right away. Be sure to wash under your fingernails.  When hiking or camping, wear long pants, a long-sleeved shirt, tall socks, and hiking boots. You can also use a lotion on your skin that helps to prevent contact with the chemical on the plant.  If you think that your clothes or outdoor gear came in contact with poison oak, rinse them off with a garden hose before you bring them inside your house.  When doing yard work or gardening, wear gloves, long sleeves, long pants, and boots. Wash your garden tools and gloves if they come in contact with poison oak.  If you think that your pet has come into contact with poison oak, wash him or her with pet shampoo and water. Make sure you wear gloves while washing your pet.  Do not burn poison oak plants. This can release the chemical from the plant into the air and   may cause a reaction. Contact a doctor if:  You have open sores in the rash area.  You have more redness, swelling, or pain in the affected area.  You have redness that spreads beyond the rash area.  You have fluid, blood, or pus coming from the affected area.  You have a fever.  You have a rash over a large area of your body.  You have a rash on your eyes, mouth, or genitals.  Your rash does not improve after a few weeks. Get help right away if:  Your face swells or your eyes swell shut.  You have trouble breathing.  You  have trouble swallowing. These symptoms may be an emergency. Do not wait to see if the symptoms will go away. Get medical help right away. Call your local emergency services (911 in the [place] Do not drive yourself to the hospital. Summary  Poison oak dermatitis is redness and soreness of the skin caused by chemicals in the leaves of the poison oak plant.  Symptoms of this condition include redness, very bad itching, a rash, and swelling.  Do not scratch or rub your skin.  Take or apply over-the-counter and prescription medicines only as told by your doctor. This information is not intended to replace advice given to you by your health care provider. Make sure you discuss any questions you have with your health care provider. Document Revised: 03/10/2019 Document Reviewed: 12/16/2018 Elsevier Patient Education  [place].

## 2020-12-03 ENCOUNTER — Other Ambulatory Visit: Payer: Self-pay

## 2020-12-03 ENCOUNTER — Other Ambulatory Visit: Payer: BC Managed Care – PPO

## 2020-12-03 DIAGNOSIS — Z20822 Contact with and (suspected) exposure to covid-19: Secondary | ICD-10-CM | POA: Diagnosis not present

## 2020-12-06 ENCOUNTER — Encounter: Payer: Self-pay | Admitting: Family Medicine

## 2020-12-09 LAB — NOVEL CORONAVIRUS, NAA: SARS-CoV-2, NAA: DETECTED — AB

## 2020-12-09 LAB — SARS-COV-2, NAA 2 DAY TAT

## 2020-12-27 ENCOUNTER — Encounter: Payer: Self-pay | Admitting: Family Medicine

## 2020-12-27 ENCOUNTER — Ambulatory Visit (INDEPENDENT_AMBULATORY_CARE_PROVIDER_SITE_OTHER): Payer: BC Managed Care – PPO | Admitting: Family Medicine

## 2020-12-27 ENCOUNTER — Other Ambulatory Visit: Payer: Self-pay

## 2020-12-27 VITALS — BP 126/74 | HR 74 | Temp 98.0°F | Resp 16 | Ht 68.0 in | Wt 225.0 lb

## 2020-12-27 DIAGNOSIS — Z136 Encounter for screening for cardiovascular disorders: Secondary | ICD-10-CM | POA: Diagnosis not present

## 2020-12-27 DIAGNOSIS — Z0000 Encounter for general adult medical examination without abnormal findings: Secondary | ICD-10-CM | POA: Diagnosis not present

## 2020-12-27 DIAGNOSIS — Z1159 Encounter for screening for other viral diseases: Secondary | ICD-10-CM

## 2020-12-27 DIAGNOSIS — Z125 Encounter for screening for malignant neoplasm of prostate: Secondary | ICD-10-CM

## 2020-12-27 DIAGNOSIS — Z1211 Encounter for screening for malignant neoplasm of colon: Secondary | ICD-10-CM | POA: Diagnosis not present

## 2020-12-27 NOTE — Progress Notes (Signed)
Complete physical exam   Patient: Matthew Giles   DOB: [birth year]   50 y.o. Male  MRN: [id number] Visit Date: 12/27/2020  Today's healthcare provider: Lelon Huh, MD   Chief Complaint  Patient presents with  . Annual Exam   Subjective    Matthew Giles is a 50 y.o. male who presents today for a complete physical exam.  He reports consuming a general diet. The patient does not participate in regular exercise at present. He generally feels well. He reports sleeping well. He does not have additional problems to discuss today.    No past medical history on file. Past Surgical History:  Procedure Laterality Date  . HERNIA REPAIR  April 3329   umbilical Dr Jamal Collin   Social History   Socioeconomic History  . Marital status: Married    Spouse name: Not on file  . Number of children: 2  . Years of education: Not on file  . Highest education level: Not on file  Occupational History  . Occupation: Sales  Tobacco Use  . Smoking status: Never Smoker  . Smokeless tobacco: Never Used  Substance and Sexual Activity  . Alcohol use: No  . Drug use: No  . Sexual activity: Not on file  Other Topics Concern  . Not on file  Social History Narrative   Has two sons and a step son   Social Determinants of Health   Financial Resource Strain: Not on file  Food Insecurity: Not on file  Transportation Needs: Not on file  Physical Activity: Not on file  Stress: Not on file  Social Connections: Not on file  Intimate Partner Violence: Not on file   Family Status  Relation Name Status  . Mother  (Not Specified)  . Father  (Not Specified)  . Neg Hx  (Not Specified)   Family History  Problem Relation Age of Onset  . Diabetes Mother   . Cancer Father        side of neck  . Colon cancer Neg Hx   . Prostate cancer Neg Hx    No Known Allergies  Patient Care Team: Birdie Sons, MD as PCP - General (Family Medicine) Christene Lye, MD (General Surgery)    Medications: Outpatient Medications Prior to Visit  Medication Sig  . esomeprazole (NEXIUM) 20 MG capsule Take 20 mg by mouth daily.  . predniSONE (DELTASONE) 10 MG tablet Take 6 pills on day 1, 5 pills on day 2 and so on until complete. (Patient not taking: Reported on 12/27/2020)   No facility-administered medications prior to visit.    Review of Systems  Constitutional: Negative.   HENT: Negative.   Eyes: Negative.   Respiratory: Negative.   Cardiovascular: Negative.   Gastrointestinal: Negative.   Endocrine: Negative.   Genitourinary: Negative.   Musculoskeletal: Negative.   Skin: Negative.   Allergic/Immunologic: Negative.   Neurological: Negative.   Hematological: Negative.   Psychiatric/Behavioral: Negative.       Objective    BP 126/74   Pulse 74   Temp 98 F (36.7 C)   Resp 16   Ht 5\' 8"  (1.727 m)   Wt 225 lb (102.1 kg)   BMI 34.21 kg/m    Physical Exam   General Appearance:    Mildly obese male. Alert, cooperative, in no acute distress, appears stated age  Head:    Normocephalic, without obvious abnormality, atraumatic  Eyes:    PERRL, conjunctiva/corneas clear, EOM's intact, fundi  benign, both eyes       Ears:    Normal TM's and external ear canals, both ears  Neck:   Supple, symmetrical, trachea midline, no adenopathy;       thyroid:  No enlargement/tenderness/nodules; no carotid   bruit or JVD  Back:     Symmetric, no curvature, ROM normal, no CVA tenderness  Lungs:     Clear to auscultation bilaterally, respirations unlabored  Chest wall:    No tenderness or deformity  Heart:    Normal heart rate. Normal rhythm. No murmurs, rubs, or gallops.  S1 and S2 normal  Abdomen:     Soft, non-tender, bowel sounds active all four quadrants,    no masses, no organomegaly  Genitalia:    deferred  Rectal:    deferred  Extremities:   All extremities are intact. No cyanosis or edema  Pulses:   2+ and symmetric all extremities  Skin:   Skin color,  texture, turgor normal, no rashes or lesions  Lymph nodes:   Cervical, supraclavicular, and axillary nodes normal  Neurologic:   CNII-XII intact. Normal strength, sensation and reflexes      throughout     Last depression screening scores PHQ 2/9 Scores 12/27/2020 10/22/2020 09/04/2019  PHQ - 2 Score 0 0 0  PHQ- 9 Score 0 0 0   Last fall risk screening Fall Risk  12/27/2020  Falls in the past year? 0  Number falls in past yr: 0  Injury with Fall? 0  Risk for fall due to : No Fall Risks  Follow up Falls evaluation completed   Last Audit-C alcohol use screening Alcohol Use Disorder Test (AUDIT) 12/27/2020  1. How often do you have a drink containing alcohol? 0  2. How many drinks containing alcohol do you have on a typical day when you are drinking? 0  3. How often do you have six or more drinks on one occasion? 0  AUDIT-C Score 0  Alcohol Brief Interventions/Follow-up AUDIT Score <7 follow-up not indicated   A score of 3 or more in women, and 4 or more in men indicates increased risk for alcohol abuse, EXCEPT if all of the points are from question 1   No results found for any visits on 12/27/20.  Assessment & Plan    Routine Health Maintenance and Physical Exam  Exercise Activities and Dietary recommendations Goals   None     Immunization History  Administered Date(s) Administered  . Moderna Sars-Covid-2 Vaccination 01/26/2020, 02/28/2020  . Tdap 04/05/2013    Health Maintenance  Topic Date Due  . Hepatitis C Screening  Never done  . HIV Screening  Never done  . COLONOSCOPY (Pts 45-37yrs Insurance coverage will need to be confirmed)  Never done  . COVID-19 Vaccine (3 - Booster for Moderna series) 08/29/2020  . INFLUENZA VACCINE  02/27/2021 (Originally 06/30/2020)  . TETANUS/TDAP  04/06/2023    Discussed health benefits of physical activity, and encouraged him to engage in regular exercise appropriate for his age and condition.  1. Annual physical exam Doing well,  is overweight, but physically active. Encourage health diet and exercise.  Advise to have labs drawn at least 1 day after his 50th birthday.   - CBC; Future - Comprehensive metabolic panel; Future - Lipid panel; Future  2. Colon cancer screening  - Ambulatory referral to gastroenterology for colonoscopy; Future  3. Need for hepatitis C screening test  - Hepatitis C antibody; Future  4. Encounter for special screening examination  for cardiovascular disorder  - Ambulatory referral to gastroenterology for colonoscopy; Future - CBC; Future - Comprehensive metabolic panel; Future - Lipid panel; Future  5. Prostate cancer screening  - PSA; Future   He declined flu vaccine       The entirety of the information documented in the History of Present Illness, Review of Systems and Physical Exam were personally obtained by me. Portions of this information were initially documented by the CMA and reviewed by me for thoroughness and accuracy.      Lelon Huh, MD  [facility] [phone] (phone) [phone] (fax)  [place]

## 2020-12-27 NOTE — Patient Instructions (Addendum)
.   Please go to the lab draw station in [address] on the second floor of [facility]. Normal hours are 8:00am to 11:30am and 1:00pm to 4:00pm Monday through Friday  

## 2021-01-08 ENCOUNTER — Telehealth (INDEPENDENT_AMBULATORY_CARE_PROVIDER_SITE_OTHER): Payer: Self-pay | Admitting: Gastroenterology

## 2021-01-08 ENCOUNTER — Telehealth: Payer: Self-pay

## 2021-01-08 ENCOUNTER — Other Ambulatory Visit: Payer: Self-pay

## 2021-01-08 DIAGNOSIS — Z1211 Encounter for screening for malignant neoplasm of colon: Secondary | ICD-10-CM

## 2021-01-08 MED ORDER — NA SULFATE-K SULFATE-MG SULF 17.5-3.13-1.6 GM/177ML PO SOLN: 1.0000 | Freq: Once | ORAL | 0 refills | Status: AC

## 2021-01-08 NOTE — Telephone Encounter (Signed)
Patient has missed his 2:30pm triage call to schedule his colonoscopy.  LVM for him to call front office back to reschedule his triage call.  Thanks,   Pauline, [place]

## 2021-01-08 NOTE — Progress Notes (Signed)
Gastroenterology Pre-Procedure Review  Request Date: 01/24/21 Requesting Physician: Dr. Allen Norris  PATIENT REVIEW QUESTIONS: The patient responded to the following health history questions as indicated:    1. Are you having any GI issues?no 2. Do you have a personal history of Polyps? No 3. Do you have a family history of Colon Cancer or Polyps? No 4. Diabetes Mellitus? No 5. Joint replacements in the past 12 months?No 6. Major health problems in the past 3 months?No 7. Any artificial heart valves, MVP, or defibrillator?No MEDICATIONS & ALLERGIES:    Patient reports the following regarding taking any anticoagulation/antiplatelet therapy:   Plavix, Coumadin, Eliquis, Xarelto, Lovenox, Pradaxa, Brilinta, or Effient? No Aspirin? No  Patient confirms/reports the following medications:  Current Outpatient Medications  Medication Sig Dispense Refill  . esomeprazole (NEXIUM) 20 MG capsule Take 20 mg by mouth daily.    . predniSONE (DELTASONE) 10 MG tablet Take 6 pills on day 1, 5 pills on day 2 and so on until complete. (Patient not taking: No sig reported) 21 tablet 0   No current facility-administered medications for this visit.    Patient confirms/reports the following allergies:  No Known Allergies  No orders of the defined types were placed in this encounter.   AUTHORIZATION INFORMATION Primary Insurance: 1D#: Group #:  Secondary Insurance: 1D#: Group #:  SCHEDULE INFORMATION: Date: 01/24/21 Time: Location:[facility]

## 2021-01-15 ENCOUNTER — Encounter: Payer: Self-pay | Admitting: Gastroenterology

## 2021-01-15 ENCOUNTER — Other Ambulatory Visit: Payer: Self-pay

## 2021-01-22 ENCOUNTER — Other Ambulatory Visit: Admission: RE | Admit: 2021-01-22 | Payer: BC Managed Care – PPO | Source: Ambulatory Visit

## 2021-01-23 NOTE — Discharge Instructions (Signed)

## 2021-01-24 ENCOUNTER — Ambulatory Visit
Admission: RE | Admit: 2021-01-24 | Discharge: 2021-01-24 | Disposition: A | Discharge status: Home / Self Care | Payer: BC Managed Care – PPO | Source: Ambulatory Visit | Attending: Gastroenterology | Admitting: Gastroenterology

## 2021-01-24 ENCOUNTER — Ambulatory Visit: Payer: BC Managed Care – PPO | Admitting: Anesthesiology

## 2021-01-24 ENCOUNTER — Other Ambulatory Visit: Payer: Self-pay

## 2021-01-24 ENCOUNTER — Encounter: Payer: Self-pay | Admitting: Gastroenterology

## 2021-01-24 ENCOUNTER — Encounter
Admission: RE | Disposition: A | Discharge status: Home / Self Care | Payer: Self-pay | Source: Ambulatory Visit | Attending: Gastroenterology

## 2021-01-24 DIAGNOSIS — D123 Benign neoplasm of transverse colon: Secondary | ICD-10-CM | POA: Diagnosis not present

## 2021-01-24 DIAGNOSIS — K635 Polyp of colon: Secondary | ICD-10-CM

## 2021-01-24 DIAGNOSIS — Z1211 Encounter for screening for malignant neoplasm of colon: Secondary | ICD-10-CM | POA: Diagnosis not present

## 2021-01-24 DIAGNOSIS — K640 First degree hemorrhoids: Secondary | ICD-10-CM | POA: Diagnosis not present

## 2021-01-24 DIAGNOSIS — D125 Benign neoplasm of sigmoid colon: Secondary | ICD-10-CM | POA: Insufficient documentation

## 2021-01-24 DIAGNOSIS — D124 Benign neoplasm of descending colon: Secondary | ICD-10-CM | POA: Insufficient documentation

## 2021-01-24 DIAGNOSIS — Z79899 Other long term (current) drug therapy: Secondary | ICD-10-CM | POA: Insufficient documentation

## 2021-01-24 HISTORY — DX: Gastro-esophageal reflux disease without esophagitis: K21.9

## 2021-01-24 HISTORY — PX: POLYPECTOMY: SHX5525

## 2021-01-24 HISTORY — PX: COLONOSCOPY WITH PROPOFOL: SHX5780

## 2021-01-24 SURGERY — COLONOSCOPY WITH PROPOFOL
Anesthesia: General

## 2021-01-24 MED ORDER — SODIUM CHLORIDE 0.9 % IV SOLN: INTRAVENOUS | Status: DC

## 2021-01-24 MED ORDER — LACTATED RINGERS IV SOLN: INTRAVENOUS | Status: DC

## 2021-01-24 MED ORDER — ACETAMINOPHEN 160 MG/5ML PO SOLN: 325.0000 mg | Freq: Once | ORAL | Status: DC

## 2021-01-24 MED ORDER — LIDOCAINE HCL (CARDIAC) PF 100 MG/5ML IV SOSY
PREFILLED_SYRINGE | INTRAVENOUS | Status: DC | PRN
  Administered 2021-01-24: 50 mg via INTRAVENOUS

## 2021-01-24 MED ORDER — PROPOFOL 10 MG/ML IV BOLUS
INTRAVENOUS | Status: DC | PRN
  Administered 2021-01-24: 150 mg via INTRAVENOUS
  Administered 2021-01-24: 40 mg via INTRAVENOUS
  Administered 2021-01-24: 30 mg via INTRAVENOUS
  Administered 2021-01-24: 40 mg via INTRAVENOUS
  Administered 2021-01-24: 30 mg via INTRAVENOUS

## 2021-01-24 MED ORDER — ACETAMINOPHEN 325 MG PO TABS: 325.0000 mg | ORAL_TABLET | Freq: Once | ORAL | Status: DC

## 2021-01-24 MED ORDER — STERILE WATER FOR IRRIGATION IR SOLN
Status: DC | PRN
  Administered 2021-01-24: 150 mL

## 2021-01-24 SURGICAL SUPPLY — 22 items
CLIP HMST 235XBRD CATH ROT (MISCELLANEOUS) IMPLANT
CLIP RESOLUTION 360 11X235 (MISCELLANEOUS)
ELECT REM PT RETURN 9FT ADLT (ELECTROSURGICAL)
ELECTRODE REM PT RTRN 9FT ADLT (ELECTROSURGICAL) IMPLANT
FORCEPS BIOP RAD 4 LRG CAP 4 (CUTTING FORCEPS) ×1 IMPLANT
GOWN CVR UNV OPN BCK APRN NK (MISCELLANEOUS) ×4 IMPLANT
GOWN ISOL THUMB LOOP REG UNIV (MISCELLANEOUS) ×6
INJECTOR VARIJECT VIN23 (MISCELLANEOUS) IMPLANT
KIT DEFENDO VALVE AND CONN (KITS) IMPLANT
KIT PRC NS LF DISP ENDO (KITS) ×2 IMPLANT
KIT PROCEDURE OLYMPUS (KITS) ×3
MANIFOLD NEPTUNE II (INSTRUMENTS) ×3 IMPLANT
MARKER SPOT ENDO TATTOO 5ML (MISCELLANEOUS) IMPLANT
PROBE APC STR FIRE (PROBE) IMPLANT
RETRIEVER NET ROTH 2.5X230 LF (MISCELLANEOUS) IMPLANT
SNARE COLD EXACTO (MISCELLANEOUS) IMPLANT
SNARE SHORT THROW 13M SML OVAL (MISCELLANEOUS) IMPLANT
SNARE SNG USE RND 15MM (INSTRUMENTS) ×1 IMPLANT
SPOT EX ENDOSCOPIC TATTOO (MISCELLANEOUS)
TRAP ETRAP POLY (MISCELLANEOUS) ×1 IMPLANT
VARIJECT INJECTOR VIN23 (MISCELLANEOUS)
WATER STERILE IRR 250ML POUR (IV SOLUTION) ×3 IMPLANT

## 2021-01-24 NOTE — Anesthesia Preprocedure Evaluation (Signed)
Anesthesia Evaluation  Patient identified by MRN, date of birth, ID band Patient awake    Reviewed: Allergy & Precautions, H&P , NPO status , Patient's Chart, lab work & pertinent test results  Airway Mallampati: II  TM Distance: >3 FB Neck ROM: full    Dental no notable dental hx.    Pulmonary    Pulmonary exam normal breath sounds clear to auscultation       Cardiovascular Normal cardiovascular exam Rhythm:regular Rate:Normal     Neuro/Psych    GI/Hepatic GERD  ,  Endo/Other    Renal/GU      Musculoskeletal   Abdominal   Peds  Hematology   Anesthesia Other Findings   Reproductive/Obstetrics                             Anesthesia Physical Anesthesia Plan  ASA: II  Anesthesia Plan: General   Post-op Pain Management:    Induction: Intravenous  PONV Risk Score and Plan: 2 and Treatment may vary due to age or medical condition, Propofol infusion and TIVA  Airway Management Planned: Natural Airway  Additional Equipment:   Intra-op Plan:   Post-operative Plan:   Informed Consent: I have reviewed the patients History and Physical, chart, labs and discussed the procedure including the risks, benefits and alternatives for the proposed anesthesia with the patient or authorized representative who has indicated his/her understanding and acceptance.     Dental Advisory Given  Plan Discussed with: CRNA  Anesthesia Plan Comments:         Anesthesia Quick Evaluation

## 2021-01-24 NOTE — Transfer of Care (Signed)
Immediate Anesthesia Transfer of Care Note  Patient: Matthew Giles  Procedure(s) Performed: COLONOSCOPY WITH PROPOFOL (N/A ) POLYPECTOMY  Patient Location: PACU  Anesthesia Type: General  Level of Consciousness: awake, alert  and patient cooperative  Airway and Oxygen Therapy: Patient Spontanous Breathing and Patient connected to supplemental oxygen  Post-op Assessment: Post-op Vital signs reviewed, Patient's Cardiovascular Status Stable, Respiratory Function Stable, Patent Airway and No signs of Nausea or vomiting  Post-op Vital Signs: Reviewed and stable  Complications: No complications documented.

## 2021-01-24 NOTE — Op Note (Signed)
[facility] Gastroenterology Patient Name: Matthew Giles Procedure Date: 01/24/2021 8:04 AM MRN: [id number] Account #: [id number] Date of Birth: 02-17-1971 Admit Type: Outpatient Age: 50 Room: [facility] OR ROOM 01 Gender: Male Note Status: Finalized Procedure:             Colonoscopy Indications:           Screening for colorectal malignant neoplasm Providers:             Lucilla Lame MD, MD Referring MD:          Kirstie Peri. Caryn Section, MD (Referring MD) Medicines:             Propofol per Anesthesia Complications:         No immediate complications. Procedure:             Pre-Anesthesia Assessment:                        - Prior to the procedure, a History and Physical was                         performed, and patient medications and allergies were                         reviewed. The patient's tolerance of previous                         anesthesia was also reviewed. The risks and benefits                         of the procedure and the sedation options and risks                         were discussed with the patient. All questions were                         answered, and informed consent was obtained. Prior                         Anticoagulants: The patient has taken no previous                         anticoagulant or antiplatelet agents. ASA Grade                         Assessment: II - A patient with mild systemic disease.                         After reviewing the risks and benefits, the patient                         was deemed in satisfactory condition to undergo the                         procedure.                        After obtaining informed consent, the colonoscope was  passed under direct vision. Throughout the procedure,                         the patient's blood pressure, pulse, and oxygen                         saturations were monitored continuously. The was                         introduced through the anus and  advanced to the the                         cecum, identified by appendiceal orifice and ileocecal                         valve. The colonoscopy was performed without                         difficulty. The patient tolerated the procedure well.                         The quality of the bowel preparation was excellent. Findings:      The perianal and digital rectal examinations were normal.      A 7 mm polyp was found in the sigmoid colon. The polyp was pedunculated.       The polyp was removed with a cold snare. Resection and retrieval were       complete.      Two sessile polyps were found in the transverse colon. The polyps were 2       to 3 mm in size. These polyps were removed with a cold biopsy forceps.       Resection and retrieval were complete.      A 3 mm polyp was found in the descending colon. The polyp was sessile.       The polyp was removed with a cold biopsy forceps. Resection and       retrieval were complete.      Non-bleeding internal hemorrhoids were found during retroflexion. The       hemorrhoids were Grade I (internal hemorrhoids that do not prolapse). Impression:            - One 7 mm polyp in the sigmoid colon, removed with a                         cold snare. Resected and retrieved.                        - Two 2 to 3 mm polyps in the transverse colon,                         removed with a cold biopsy forceps. Resected and                         retrieved.                        - One 3 mm polyp in the descending colon, removed with  a cold biopsy forceps. Resected and retrieved.                        - Non-bleeding internal hemorrhoids. Recommendation:        - Discharge patient to home.                        - Resume previous diet.                        - Continue present medications.                        - Await pathology results.                        - Repeat colonoscopy in 5 years if polyp adenoma and                          10 years if hyperplastic Procedure Code(s):     --- Professional ---                        [phone], Colonoscopy, flexible; with removal of                         tumor(s), polyp(s), or other lesion(s) by snare                         technique                        45380, 37, Colonoscopy, flexible; with biopsy, single                         or multiple Diagnosis Code(s):     --- Professional ---                        Z12.11, Encounter for screening for malignant neoplasm                         of colon                        K63.5, Polyp of colon CPT copyright 2019 American Medical Association. All rights reserved. The codes documented in this report are preliminary and upon coder review may  be revised to meet current compliance requirements. Lucilla Lame MD, MD 01/24/2021 8:31:09 AM This report has been signed electronically. Number of Addenda: 0 Note Initiated On: 01/24/2021 8:04 AM Scope Withdrawal Time: 0 hours 9 minutes 50 seconds  Total Procedure Duration: 0 hours 11 minutes 29 seconds  Estimated Blood Loss:  Estimated blood loss: none.      [facility]

## 2021-01-24 NOTE — Anesthesia Procedure Notes (Signed)
Procedure Name: MAC Date/Time: 01/24/2021 8:12 AM Performed by: Cameron Ali, CRNA Pre-anesthesia Checklist: Patient identified, Emergency Drugs available, Suction available, Timeout performed and Patient being monitored Patient Re-evaluated:Patient Re-evaluated prior to induction Oxygen Delivery Method: Nasal cannula Placement Confirmation: positive ETCO2

## 2021-01-24 NOTE — H&P (Signed)
   Matthew Lame, MD [facility] [address] [name], [place] [address] Phone: [phone] Fax : [phone]  Primary Care Physician:  Matthew Sons, MD Primary Gastroenterologist:  Dr. Allen Giles  Pre-Procedure History & Physical: HPI:  Matthew Giles is a 50 y.o. male is here for a screening colonoscopy.   Past Medical History:  Diagnosis Date  . GERD (gastroesophageal reflux disease)     Past Surgical History:  Procedure Laterality Date  . HERNIA REPAIR  April 1937   umbilical Dr Matthew Giles    Prior to Admission medications   Medication Sig Start Date End Date Taking? Authorizing Provider  Ascorbic Acid (VITAMIN C) 100 MG tablet Take 100 mg by mouth daily.   Yes [provider]  esomeprazole (NEXIUM) 20 MG capsule Take 20 mg by mouth daily.   Yes [provider]  Multiple Vitamin (MULTIVITAMIN) capsule Take 1 capsule by mouth daily.   Yes [provider]  predniSONE (DELTASONE) 10 MG tablet Take 6 pills on day 1, 5 pills on day 2 and so on until complete. Patient not taking: No sig reported 10/22/20   Matthew Post, PA-C    Allergies as of 01/08/2021  . (No Known Allergies)    Family History  Problem Relation Age of Onset  . Diabetes Mother   . Cancer Father        side of neck  . Colon cancer Neg Hx   . Prostate cancer Neg Hx     Social History   Socioeconomic History  . Marital status: Married    Spouse name: Not on file  . Number of children: 2  . Years of education: Not on file  . Highest education level: Not on file  Occupational History  . Occupation: Sales  Tobacco Use  . Smoking status: Never Smoker  . Smokeless tobacco: Never Used  Substance and Sexual Activity  . Alcohol use: No  . Drug use: No  . Sexual activity: Not on file  Other Topics Concern  . Not on file  Social History Narrative   Has two Giles and a step son   Social Determinants of Health   Financial Resource Strain: Not on file  Food Insecurity:  Not on file  Transportation Needs: Not on file  Physical Activity: Not on file  Stress: Not on file  Social Connections: Not on file  Intimate Partner Violence: Not on file    Review of Systems: See HPI, otherwise negative ROS  Physical Exam: BP 120/72   Pulse 69   Temp (!) 97 F (36.1 C) (Temporal)   Ht 5\' 8"  (1.727 m)   Wt 97.1 kg   SpO2 99%   BMI 32.54 kg/m  General:   Alert,  pleasant and cooperative in NAD Head:  Normocephalic and atraumatic. Neck:  Supple; no masses or thyromegaly. Lungs:  Clear throughout to auscultation.    Heart:  Regular rate and rhythm. Abdomen:  Soft, nontender and nondistended. Normal bowel sounds, without guarding, and without rebound.   Neurologic:  Alert and  oriented x4;  grossly normal neurologically.  Impression/Plan: Matthew Giles is now here to undergo a screening colonoscopy.  Risks, benefits, and alternatives regarding colonoscopy have been reviewed with the patient.  Questions have been answered.  All parties agreeable.

## 2021-01-24 NOTE — Anesthesia Postprocedure Evaluation (Signed)
Anesthesia Post Note  Patient: Matthew Giles  Procedure(s) Performed: COLONOSCOPY WITH PROPOFOL (N/A ) POLYPECTOMY     Patient location during evaluation: PACU Anesthesia Type: General Level of consciousness: awake and alert and oriented Pain management: satisfactory to patient Vital Signs Assessment: post-procedure vital signs reviewed and stable Respiratory status: spontaneous breathing, nonlabored ventilation and respiratory function stable Cardiovascular status: blood pressure returned to baseline and stable Postop Assessment: Adequate PO intake and No signs of nausea or vomiting Anesthetic complications: no   No complications documented.  Raliegh Ip

## 2021-01-27 ENCOUNTER — Encounter: Payer: Self-pay | Admitting: Gastroenterology

## 2021-01-28 ENCOUNTER — Encounter: Payer: Self-pay | Admitting: Gastroenterology

## 2021-01-28 ENCOUNTER — Encounter: Payer: Self-pay | Admitting: Family Medicine

## 2021-01-28 DIAGNOSIS — Z860101 Personal history of adenomatous and serrated colon polyps: Secondary | ICD-10-CM | POA: Insufficient documentation

## 2021-01-28 DIAGNOSIS — Z86010 Personal history of colonic polyps: Secondary | ICD-10-CM | POA: Insufficient documentation

## 2021-01-28 LAB — SURGICAL PATHOLOGY

## 2022-01-16 ENCOUNTER — Other Ambulatory Visit: Payer: Self-pay

## 2022-01-16 ENCOUNTER — Encounter: Payer: Self-pay | Admitting: Family Medicine

## 2022-01-16 ENCOUNTER — Ambulatory Visit (INDEPENDENT_AMBULATORY_CARE_PROVIDER_SITE_OTHER): Payer: BC Managed Care – PPO | Admitting: Family Medicine

## 2022-01-16 VITALS — BP 120/88 | HR 73 | Temp 98.3°F | Resp 16 | Ht 68.0 in | Wt 224.2 lb

## 2022-01-16 DIAGNOSIS — E662 Morbid (severe) obesity with alveolar hypoventilation: Secondary | ICD-10-CM | POA: Diagnosis not present

## 2022-01-16 DIAGNOSIS — Z125 Encounter for screening for malignant neoplasm of prostate: Secondary | ICD-10-CM | POA: Insufficient documentation

## 2022-01-16 DIAGNOSIS — L853 Xerosis cutis: Secondary | ICD-10-CM

## 2022-01-16 DIAGNOSIS — L918 Other hypertrophic disorders of the skin: Secondary | ICD-10-CM | POA: Insufficient documentation

## 2022-01-16 DIAGNOSIS — Z6834 Body mass index (BMI) 34.0-34.9, adult: Secondary | ICD-10-CM | POA: Insufficient documentation

## 2022-01-16 DIAGNOSIS — R152 Fecal urgency: Secondary | ICD-10-CM | POA: Diagnosis not present

## 2022-01-16 DIAGNOSIS — Z1159 Encounter for screening for other viral diseases: Secondary | ICD-10-CM | POA: Diagnosis not present

## 2022-01-16 DIAGNOSIS — Z0000 Encounter for general adult medical examination without abnormal findings: Secondary | ICD-10-CM | POA: Diagnosis not present

## 2022-01-16 DIAGNOSIS — E7800 Pure hypercholesterolemia, unspecified: Secondary | ICD-10-CM | POA: Diagnosis not present

## 2022-01-16 DIAGNOSIS — K429 Umbilical hernia without obstruction or gangrene: Secondary | ICD-10-CM

## 2022-01-16 DIAGNOSIS — Z114 Encounter for screening for human immunodeficiency virus [HIV]: Secondary | ICD-10-CM | POA: Insufficient documentation

## 2022-01-16 DIAGNOSIS — Z6831 Body mass index (BMI) 31.0-31.9, adult: Secondary | ICD-10-CM | POA: Diagnosis not present

## 2022-01-16 NOTE — Assessment & Plan Note (Signed)
PSA done in place of DRE; no complaints of LUTS

## 2022-01-16 NOTE — Assessment & Plan Note (Signed)
Complaints of dry, scaly skin- seasonal Worse in cold months- treated with baby oils Recommend use of Eucerin or Vanicream to assist No cellulitis seen on examination No edema No change in color or temperature to skin

## 2022-01-16 NOTE — Assessment & Plan Note (Signed)
Weight gain of 10 lbs since last visit Pt notes common fluctuation Discussed importance of healthy weight management Discussed diet and exercise

## 2022-01-16 NOTE — Assessment & Plan Note (Signed)
Complaints of rectal urgency Denies loss of bowels Occasionally will have a drop or two in his pants if he does not go right away Hx of colon polyps Deferred exam encouraged use of gait belt for heavy lifting and weight reduction; increase fiber in diet; and rectal/butt squeezes to assist with tone Pt encouraged to seek additional care if increase in frequency or change in bowel habits, color, consistency

## 2022-01-16 NOTE — Progress Notes (Signed)
Complete physical exam   Patient: Matthew Giles   DOB: Feb 09, 1971   51 y.o. Male  MRN: [id number] Visit Date: 01/16/2022  Today's healthcare provider: Gwyneth Sprout, FNP   Chief Complaint  Patient presents with   Annual Exam   I,Sulibeya S Dimas,acting as a scribe for Gwyneth Sprout, FNP.,have documented all relevant documentation on the behalf of Gwyneth Sprout, FNP,as directed by  Gwyneth Sprout, FNP while in the presence of Gwyneth Sprout, FNP.  Subjective    Matthew Giles is a 51 y.o. male who presents today for a complete physical exam.  He reports consuming a general diet. The patient has a physically strenuous job, but has no regular exercise apart from work.  He generally feels well. He reports sleeping well. He does not have additional problems to discuss today.  HPI    Past Medical History:  Diagnosis Date   GERD (gastroesophageal reflux disease)    Past Surgical History:  Procedure Laterality Date   COLONOSCOPY WITH PROPOFOL N/A 01/24/2021   Procedure: COLONOSCOPY WITH PROPOFOL;  Surgeon: Lucilla Lame, MD;  Location: [facility];  Service: Endoscopy;  Laterality: N/A;  priority 4 COVID + 12-09-20   HERNIA REPAIR  April 6294   umbilical Dr Jamal Collin   POLYPECTOMY  01/24/2021   Procedure: POLYPECTOMY;  Surgeon: Lucilla Lame, MD;  Location: [facility];  Service: Endoscopy;;   Social History   Socioeconomic History   Marital status: Married    Spouse name: Not on file   Number of children: 2   Years of education: Not on file   Highest education level: Not on file  Occupational History   Occupation: Sales  Tobacco Use   Smoking status: Never   Smokeless tobacco: Never  Vaping Use   Vaping Use: Never used  Substance and Sexual Activity   Alcohol use: No   Drug use: No   Sexual activity: Not on file  Other Topics Concern   Not on file  Social History Narrative   Has two sons and a step son   Social Determinants of Health   Financial  Resource Strain: Not on file  Food Insecurity: Not on file  Transportation Needs: Not on file  Physical Activity: Not on file  Stress: Not on file  Social Connections: Not on file  Intimate Partner Violence: Not on file   Family Status  Relation Name Status   Mother  Alive   Father  Alive   Son  Alive   Son  Alive   Neg Hx  (Not Specified)   Family History  Problem Relation Age of Onset   Diabetes Mother    Cancer Father        side of neck   Colon cancer Neg Hx    Prostate cancer Neg Hx    No Known Allergies  Patient Care Team: Birdie Sons, MD as PCP - General (Family Medicine) Christene Lye, MD (General Surgery)   Medications: Outpatient Medications Prior to Visit  Medication Sig   esomeprazole (NEXIUM) 20 MG capsule Take 20 mg by mouth daily.   [DISCONTINUED] Ascorbic Acid (VITAMIN C) 100 MG tablet Take 100 mg by mouth daily.   [DISCONTINUED] Multiple Vitamin (MULTIVITAMIN) capsule Take 1 capsule by mouth daily.   [DISCONTINUED] predniSONE (DELTASONE) 10 MG tablet Take 6 pills on day 1, 5 pills on day 2 and so on until complete. (Patient not taking: No sig reported)  No facility-administered medications prior to visit.    Review of Systems  All other systems reviewed and are negative.  Last metabolic panel Lab Results  Component Value Date   GLUCOSE 89 06/23/2017   NA 140 06/23/2017   K 4.2 06/23/2017   CL 101 06/23/2017   CO2 25 06/23/2017   BUN 25 (H) 06/23/2017   CREATININE 1.06 06/23/2017   GFRNONAA 84 06/23/2017   CALCIUM 9.3 06/23/2017   PROT 7.4 06/23/2017   ALBUMIN 4.7 06/23/2017   LABGLOB 2.7 06/23/2017   AGRATIO 1.7 06/23/2017   BILITOT 0.4 06/23/2017   ALKPHOS 75 06/23/2017   AST 22 06/23/2017   ALT 26 06/23/2017   Last lipids Lab Results  Component Value Date   CHOL 163 06/23/2017   HDL 53 06/23/2017   LDLCALC 85 06/23/2017   TRIG 123 06/23/2017   CHOLHDL 3.1 06/23/2017     Objective    BP 120/88 (BP Location:  Right Arm, Patient Position: Sitting, Cuff Size: Large)    Pulse 73    Temp 98.3 F (36.8 C) (Temporal)    Resp 16    Ht 5\' 8"  (1.727 m)    Wt 224 lb 3.2 oz (101.7 kg)    SpO2 98%    BMI 34.09 kg/m  BP Readings from Last 3 Encounters:  01/16/22 120/88  01/24/21 124/81  12/27/20 126/74   Wt Readings from Last 3 Encounters:  01/16/22 224 lb 3.2 oz (101.7 kg)  01/24/21 214 lb (97.1 kg)  12/27/20 225 lb (102.1 kg)      Physical Exam Vitals and nursing note reviewed.  Constitutional:      General: He is awake. He is not in acute distress.    Appearance: Normal appearance. He is well-developed and well-groomed. He is obese. He is not ill-appearing, toxic-appearing or diaphoretic.  HENT:     Head: Normocephalic and atraumatic.     Jaw: There is normal jaw occlusion. No trismus, tenderness, swelling or pain on movement.     Salivary Glands: Right salivary gland is not diffusely enlarged or tender. Left salivary gland is not diffusely enlarged or tender.     Comments: UTD on dental exams    Right Ear: Hearing, tympanic membrane, ear canal and external ear normal. There is no impacted cerumen.     Left Ear: Hearing, tympanic membrane, ear canal and external ear normal. There is no impacted cerumen.     Nose: Nose normal. No congestion or rhinorrhea.     Right Turbinates: Not enlarged, swollen or pale.     Left Turbinates: Not enlarged, swollen or pale.     Right Sinus: No maxillary sinus tenderness or frontal sinus tenderness.     Left Sinus: No maxillary sinus tenderness or frontal sinus tenderness.     Mouth/Throat:     Lips: Pink.     Mouth: Mucous membranes are moist. No injury, lacerations, oral lesions or angioedema.     Pharynx: Oropharynx is clear. Uvula midline. No pharyngeal swelling, oropharyngeal exudate or posterior oropharyngeal erythema.     Tonsils: No tonsillar exudate or tonsillar abscesses.  Eyes:     General: Lids are normal. Vision grossly intact. Gaze aligned  appropriately.        Right eye: No discharge.        Left eye: No discharge.     Extraocular Movements: Extraocular movements intact.     Conjunctiva/sclera: Conjunctivae normal.     Pupils: Pupils are equal, round, and reactive to light.  Comments: UTD on vision exams  Neck:     Thyroid: No thyroid mass, thyromegaly or thyroid tenderness.     Vascular: No carotid bruit.     Trachea: Trachea normal. No tracheal tenderness.  Cardiovascular:     Rate and Rhythm: Normal rate and regular rhythm.     Pulses: Normal pulses.          Carotid pulses are 2+ on the right side and 2+ on the left side.      Radial pulses are 2+ on the right side and 2+ on the left side.       Femoral pulses are 2+ on the right side and 2+ on the left side.      Popliteal pulses are 2+ on the right side and 2+ on the left side.       Dorsalis pedis pulses are 2+ on the right side and 2+ on the left side.       Posterior tibial pulses are 2+ on the right side and 2+ on the left side.     Heart sounds: Normal heart sounds, S1 normal and S2 normal. No murmur heard.   No friction rub. No gallop.  Pulmonary:     Effort: Pulmonary effort is normal. No respiratory distress.     Breath sounds: Normal breath sounds and air entry. No stridor. No wheezing, rhonchi or rales.  Chest:     Chest wall: No tenderness.  Abdominal:     General: Abdomen is flat. Bowel sounds are normal. There is no distension.     Palpations: Abdomen is soft. There is no mass.     Tenderness: There is no abdominal tenderness. There is no guarding or rebound.     Hernia: A hernia is present. Hernia is present in the umbilical area.     Comments: Small, <1 cm; contained, does not reduce  Genitourinary:    Comments: Exam deferred; denies complaints of LUTS; DRE deferred, PSA ordered  Complaints of occasional rectal urgency- denies fecal leaking; due for colonoscopy in 2027 Musculoskeletal:        General: No swelling, tenderness, deformity or  signs of injury. Normal range of motion.     Cervical back: Normal range of motion and neck supple. No rigidity or tenderness.     Right lower leg: No edema.     Left lower leg: No edema.  Lymphadenopathy:     Cervical: No cervical adenopathy.     Right cervical: No superficial, deep or posterior cervical adenopathy.    Left cervical: No superficial, deep or posterior cervical adenopathy.  Skin:    General: Skin is warm and dry.     Capillary Refill: Capillary refill takes less than 2 seconds.     Coloration: Skin is not jaundiced or pale.     Findings: No bruising, erythema, lesion or rash.          Comments: Pt had OTC patch on 'to assist with removal of skin tag' Complaints of dry skin on BLE and back  Neurological:     General: No focal deficit present.     Mental Status: He is alert and oriented to person, place, and time. Mental status is at baseline.     GCS: GCS eye subscore is 4. GCS verbal subscore is 5. GCS motor subscore is 6.     Sensory: Sensation is intact. No sensory deficit.     Motor: Motor function is intact. No weakness.     Coordination: Coordination  is intact.     Gait: Gait is intact.  Psychiatric:        Attention and Perception: Attention and perception normal.        Mood and Affect: Mood and affect normal.        Speech: Speech normal.        Behavior: Behavior normal. Behavior is cooperative.        Thought Content: Thought content normal.        Cognition and Memory: Cognition normal.        Judgment: Judgment normal.     Last depression screening scores PHQ 2/9 Scores 01/16/2022 12/27/2020 10/22/2020  PHQ - 2 Score 0 0 0  PHQ- 9 Score 0 0 0   Last fall risk screening Fall Risk  01/16/2022  Falls in the past year? 0  Number falls in past yr: 0  Injury with Fall? 0  Risk for fall due to : No Fall Risks  Follow up Falls evaluation completed   Last Audit-C alcohol use screening Alcohol Use Disorder Test (AUDIT) 01/16/2022  1. How often do you  have a drink containing alcohol? 0  2. How many drinks containing alcohol do you have on a typical day when you are drinking? 0  3. How often do you have six or more drinks on one occasion? 0  AUDIT-C Score 0  Alcohol Brief Interventions/Follow-up -   A score of 3 or more in women, and 4 or more in men indicates increased risk for alcohol abuse, EXCEPT if all of the points are from question 1   No results found for any visits on 01/16/22.  Assessment & Plan    Routine Health Maintenance and Physical Exam  Exercise Activities and Dietary recommendations  Goals   None     Immunization History  Administered Date(s) Administered   Moderna Sars-Covid-2 Vaccination 01/26/2020, 02/28/2020   Tdap 04/05/2013    Health Maintenance  Topic Date Due   Hepatitis C Screening  Never done   Zoster Vaccines- Shingrix (1 of 2) Never done   COVID-19 Vaccine (3 - Moderna risk series) 02/01/2022 (Originally 03/27/2020)   INFLUENZA VACCINE  02/27/2022 (Originally 06/30/2021)   TETANUS/TDAP  04/06/2023   COLONOSCOPY (Pts 45-70yrs Insurance coverage will need to be confirmed)  01/24/2026   HIV Screening  Completed   HPV VACCINES  Aged Out    Discussed health benefits of physical activity, and encouraged him to engage in regular exercise appropriate for his age and condition.  Problem List Items Addressed This Visit       Musculoskeletal and Integument   Skin tag    Skin tag seen on L neck at chin/neck flexion point Had OTC dressing in place Referral placed to derm to assist with removal and establish care for TBSE      Relevant Orders   Ambulatory referral to Dermatology   Dry skin dermatitis    Complaints of dry, scaly skin- seasonal Worse in cold months- treated with baby oils Recommend use of Eucerin or Vanicream to assist No cellulitis seen on examination No edema No change in color or temperature to skin      Relevant Orders   Ambulatory referral to Dermatology     Other    Umbilical hernia    Small, 97mm Does not reduce Contained Continue to monitor      Annual physical exam - Primary    UTD on dental UTD on vision Things to do to keep yourself healthy  -  Exercise at least 30-45 minutes a day, 3-4 days a week.  - Eat a low-fat diet with lots of fruits and vegetables, up to 7-9 servings per day.  - Seatbelts can save your life. Wear them always.  - Smoke detectors on every level of your home, check batteries every year.  - Eye Doctor - have an eye exam every 1-2 years  - Safe sex - if you may be exposed to STDs, use a condom.  - Alcohol -  If you drink, do it moderately, less than 2 drinks per day.  - [place]. Choose someone to speak for you if you are not able.  - Depression is common in our stressful world.If you're feeling down or losing interest in things you normally enjoy, please come in for a visit.  - Violence - If anyone is threatening or hurting you, please call immediately.        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)    Weight gain of 10 lbs since last visit Pt notes common fluctuation Discussed importance of healthy weight management Discussed diet and exercise       Rectal urgency    Complaints of rectal urgency Denies loss of bowels Occasionally will have a drop or two in his pants if he does not go right away Hx of colon polyps Deferred exam encouraged use of gait belt for heavy lifting and weight reduction;  increase fiber in diet; and rectal/butt squeezes to assist with tone Pt encouraged to seek additional care if increase in frequency or change in bowel habits, color, consistency        Return in about 1 year (around 01/16/2023).     Vonna Kotyk, FNP, have reviewed all documentation for this visit. The documentation on 01/16/22 for the exam, diagnosis, procedures, and orders are all accurate and complete.    Gwyneth Sprout, [place] [phone] (phone) [phone] (fax)  [place]

## 2022-01-16 NOTE — Assessment & Plan Note (Signed)
Low risk screen °

## 2022-01-16 NOTE — Assessment & Plan Note (Signed)
Small, 18mm Does not reduce Contained Continue to monitor

## 2022-01-16 NOTE — Assessment & Plan Note (Signed)
Skin tag seen on L neck at chin/neck flexion point Had OTC dressing in place Referral placed to derm to assist with removal and establish care for TBSE

## 2022-01-16 NOTE — Assessment & Plan Note (Signed)
UTD on dental °UTD on vision °Things to do to keep yourself healthy  °- Exercise at least 30-45 minutes a day, 3-4 days a week.  °- Eat a low-fat diet with lots of fruits and vegetables, up to 7-9 servings per day.  °- Seatbelts can save your life. Wear them always.  °- Smoke detectors on every level of your home, check batteries every year.  °- Eye Doctor - have an eye exam every 1-2 years  °- Safe sex - if you may be exposed to STDs, use a condom.  °- Alcohol -  If you drink, do it moderately, less than 2 drinks per day.  °- Health Care Power of Attorney. Choose someone to speak for you if you are not able.  °- Depression is common in our stressful world.If you're feeling down or losing interest in things you normally enjoy, please come in for a visit.  °- Violence - If anyone is threatening or hurting you, please call immediately. ° ° °

## 2022-01-17 LAB — COMPREHENSIVE METABOLIC PANEL
ALT: 50 IU/L — ABNORMAL HIGH (ref 0–44)
AST: 26 IU/L (ref 0–40)
Albumin/Globulin Ratio: 1.9 (ref 1.2–2.2)
Albumin: 4.7 g/dL (ref 3.8–4.9)
Alkaline Phosphatase: 76 IU/L (ref 44–121)
BUN/Creatinine Ratio: 20 (ref 9–20)
BUN: 24 mg/dL (ref 6–24)
Bilirubin Total: 0.6 mg/dL (ref 0.0–1.2)
CO2: 25 mmol/L (ref 20–29)
Calcium: 9.4 mg/dL (ref 8.7–10.2)
Chloride: 103 mmol/L (ref 96–106)
Creatinine, Ser: 1.21 mg/dL (ref 0.76–1.27)
Globulin, Total: 2.5 g/dL (ref 1.5–4.5)
Glucose: 96 mg/dL (ref 70–99)
Potassium: 4.6 mmol/L (ref 3.5–5.2)
Sodium: 144 mmol/L (ref 134–144)
Total Protein: 7.2 g/dL (ref 6.0–8.5)
eGFR: 72 mL/min/{1.73_m2} (ref >59)

## 2022-01-17 LAB — LIPID PANEL WITH LDL/HDL RATIO
Cholesterol, Total: 196 mg/dL (ref 100–199)
HDL: 47 mg/dL (ref >39)
LDL Chol Calc (NIH): 128 mg/dL — ABNORMAL HIGH (ref 0–99)
LDL/HDL Ratio: 2.7 ratio (ref 0.0–3.6)
Triglycerides: 118 mg/dL (ref 0–149)
VLDL Cholesterol Cal: 21 mg/dL (ref 5–40)

## 2022-01-17 LAB — HIV ANTIBODY (ROUTINE TESTING W REFLEX): HIV Screen 4th Generation wRfx: NONREACTIVE

## 2022-01-17 LAB — HEPATITIS C ANTIBODY: Hep C Virus Ab: NONREACTIVE

## 2022-01-17 LAB — PSA: Prostate Specific Ag, Serum: 0.9 ng/mL (ref 0.0–4.0)

## 2022-03-02 ENCOUNTER — Other Ambulatory Visit: Payer: Self-pay | Admitting: Family Medicine

## 2022-03-24 ENCOUNTER — Other Ambulatory Visit: Payer: Self-pay | Admitting: Family Medicine

## 2022-07-06 ENCOUNTER — Encounter: Payer: Self-pay | Admitting: Dermatology

## 2022-07-06 ENCOUNTER — Ambulatory Visit: Payer: BC Managed Care – PPO | Admitting: Dermatology

## 2022-07-06 DIAGNOSIS — L738 Other specified follicular disorders: Secondary | ICD-10-CM

## 2022-07-06 DIAGNOSIS — Z872 Personal history of diseases of the skin and subcutaneous tissue: Secondary | ICD-10-CM

## 2022-07-06 DIAGNOSIS — L918 Other hypertrophic disorders of the skin: Secondary | ICD-10-CM | POA: Diagnosis not present

## 2022-07-06 DIAGNOSIS — L578 Other skin changes due to chronic exposure to nonionizing radiation: Secondary | ICD-10-CM

## 2022-07-06 DIAGNOSIS — L308 Other specified dermatitis: Secondary | ICD-10-CM

## 2022-07-06 MED ORDER — MOMETASONE FUROATE 0.1 % EX CREA
1.0000 | TOPICAL_CREAM | Freq: Every day | CUTANEOUS | 1 refills | Status: AC | PRN
Start: 2022-07-06 — End: ?

## 2022-07-06 NOTE — Patient Instructions (Signed)
Due to recent changes in healthcare laws, you may see results of your pathology and/or laboratory studies on MyChart before the doctors have had a chance to review them. We understand that in some cases there may be results that are confusing or concerning to you. Please understand that not all results are received at the same time and often the doctors may need to interpret multiple results in order to provide you with the best plan of care or course of treatment. Therefore, we ask that you please give us 2 business days to thoroughly review all your results before contacting the office for clarification. Should we see a critical lab result, you will be contacted sooner.   If You Need Anything After Your Visit  If you have any questions or concerns for your doctor, please call our main line at [phone] and press option 4 to reach your doctor's medical assistant. If no one answers, please leave a voicemail as directed and we will return your call as soon as possible. Messages left after 4 pm will be answered the following business day.   You may also send us a message via MyChart. We typically respond to MyChart messages within 1-2 business days.  For prescription refills, please ask your pharmacy to contact our office. Our fax number is [phone].  If you have an urgent issue when the clinic is closed that cannot wait until the next business day, you can page your doctor at the number below.    Please note that while we do our best to be available for urgent issues outside of office hours, we are not available 24/7.   If you have an urgent issue and are unable to reach us, you may choose to seek medical care at your doctor's office, retail clinic, urgent care center, or emergency room.  If you have a medical emergency, please immediately call 911 or go to the emergency department.  Pager Numbers  - Dr. Kowalski: [phone]  - Dr. Moye: [phone]  - Dr. Stewart:  [phone]  In the event of inclement weather, please call our main line at [phone] for an update on the status of any delays or closures.  Dermatology Medication Tips: Please keep the boxes that topical medications come in in order to help keep track of the instructions about where and how to use these. Pharmacies typically print the medication instructions only on the boxes and not directly on the medication tubes.   If your medication is too expensive, please contact our office at [phone] option 4 or send us a message through MyChart.   We are unable to tell what your co-pay for medications will be in advance as this is different depending on your insurance coverage. However, we may be able to find a substitute medication at lower cost or fill out paperwork to get insurance to cover a needed medication.   If a prior authorization is required to get your medication covered by your insurance company, please allow us 1-2 business days to complete this process.  Drug prices often vary depending on where the prescription is filled and some pharmacies may offer cheaper prices.  The website www.goodrx.com contains coupons for medications through different pharmacies. The prices here do not account for what the cost may be with help from insurance (it may be cheaper with your insurance), but the website can give you the price if you did not use any insurance.  - You can print the associated coupon and take it with   your prescription to the pharmacy.  - You may also stop by our office during regular business hours and pick up a GoodRx coupon card.  - If you need your prescription sent electronically to a different pharmacy, notify our office through [facility] MyChart or by phone at [phone] option 4.     Si Usted Necesita Algo Despus de Su Visita  Tambin puede enviarnos un mensaje a travs de MyChart. Por lo general respondemos a los mensajes de MyChart en el transcurso de 1 a 2  das hbiles.  Para renovar recetas, por favor pida a su farmacia que se ponga en contacto con nuestra oficina. Nuestro nmero de fax es el [phone].  Si tiene un asunto urgente cuando la clnica est cerrada y que no puede esperar hasta el siguiente da hbil, puede llamar/localizar a su doctor(a) al nmero que aparece a continuacin.   Por favor, tenga en cuenta que aunque hacemos todo lo posible para estar disponibles para asuntos urgentes fuera del horario de oficina, no estamos disponibles las 24 horas del da, los 7 das de la semana.   Si tiene un problema urgente y no puede comunicarse con nosotros, puede optar por buscar atencin mdica  en el consultorio de su doctor(a), en una clnica privada, en un centro de atencin urgente o en una sala de emergencias.  Si tiene una emergencia mdica, por favor llame inmediatamente al 911 o vaya a la sala de emergencias.  Nmeros de bper  - Dr. Kowalski: [phone]  - Dra. Moye: [phone]  - Dra. Stewart: [phone]  En caso de inclemencias del tiempo, por favor llame a nuestra lnea principal al [phone] para una actualizacin sobre el estado de cualquier retraso o cierre.  Consejos para la medicacin en dermatologa: Por favor, guarde las cajas en las que vienen los medicamentos de uso tpico para ayudarle a seguir las instrucciones sobre dnde y cmo usarlos. Las farmacias generalmente imprimen las instrucciones del medicamento slo en las cajas y no directamente en los tubos del medicamento.   Si su medicamento es muy caro, por favor, pngase en contacto con nuestra oficina llamando al [phone] y presione la opcin 4 o envenos un mensaje a travs de MyChart.   No podemos decirle cul ser su copago por los medicamentos por adelantado ya que esto es diferente dependiendo de la cobertura de su seguro. Sin embargo, es posible que podamos encontrar un medicamento sustituto a menor costo o llenar un formulario para que el  seguro cubra el medicamento que se considera necesario.   Si se requiere una autorizacin previa para que su compaa de seguros cubra su medicamento, por favor permtanos de 1 a 2 das hbiles para completar este proceso.  Los precios de los medicamentos varan con frecuencia dependiendo del lugar de dnde se surte la receta y alguna farmacias pueden ofrecer precios ms baratos.  El sitio web www.goodrx.com tiene cupones para medicamentos de diferentes farmacias. Los precios aqu no tienen en cuenta lo que podra costar con la ayuda del seguro (puede ser ms barato con su seguro), pero el sitio web puede darle el precio si no utiliz ningn seguro.  - Puede imprimir el cupn correspondiente y llevarlo con su receta a la farmacia.  - Tambin puede pasar por nuestra oficina durante el horario de atencin regular y recoger una tarjeta de cupones de GoodRx.  - Si necesita que su receta se enve electrnicamente a una farmacia diferente, informe a nuestra oficina a travs de MyChart de [facility]   o por telfono llamando al [phone] y presione la opcin 4.  

## 2022-07-06 NOTE — Progress Notes (Signed)
   New Patient Visit  Subjective  Matthew Giles is a 51 y.o. male who presents for the following: Other (Dry spots on face. He has a history of a rash that is worse during the fall and winter. It is clear today.) and Skin Tag (Bilateral axilla, groin). The patient has spots, moles and lesions to be evaluated, some may be new or changing and the patient has concerns that these could be cancer.  The following portions of the chart were reviewed this encounter and updated as appropriate:   Tobacco  Allergies  Meds  Problems  Med Hx  Surg Hx  Fam Hx     Review of Systems:  No other skin or systemic complaints except as noted in HPI or Assessment and Plan.  Objective  Well appearing patient in no apparent distress; mood and affect are within normal limits.  A focused examination was performed including face, arms. Relevant physical exam findings are noted in the Assessment and Plan.  Clear today  Head - Anterior (Face) Yellow papules   Assessment & Plan   Actinic Damage - chronic, secondary to cumulative UV radiation exposure/sun exposure over time - diffuse scaly erythematous macules with underlying dyspigmentation - Recommend daily broad spectrum sunscreen SPF 30+ to sun-exposed areas, reapply every 2 hours as needed.  - Recommend staying in the shade or wearing long sleeves, sun glasses (UVA+UVB protection) and wide brim hats (4-inch brim around the entire circumference of the hat). - Call for new or changing lesions.  Acrochordons (Skin Tags) - Fleshy, skin-colored pedunculated papules - Benign appearing.  - Observe. - If desired, they can be removed with an in office procedure that is not covered by insurance ($115 out of pocket fee discussed). - Please call the clinic if you notice any new or changing lesions.  Sebaceous hyperplasia Head - Anterior (Face) Benign-appearing.  Observation.  Call clinic for new or changing lesions.  Recommend daily use of broad spectrum  spf 30+ sunscreen to sun-exposed areas.  Discussed treatment option of electrodesiccation (for cosmetic reasons.  Fee is out-of-pocket $60 for first and $15 for each additional.  Patient may consider.  History of probable eczema Of the arms that is flared during the winter. Clear today. Start Mometasone cream qd if condition flares  mometasone (ELOCON) 0.1 % cream Apply 1 Application topically daily as needed (Rash).  Return if symptoms worsen or fail to improve.  I, Ashok Cordia, CMA, am acting as scribe for Sarina Ser, MD . Documentation: I have reviewed the above documentation for accuracy and completeness, and I agree with the above.  Sarina Ser, MD

## 2022-08-10 ENCOUNTER — Ambulatory Visit: Payer: Self-pay

## 2022-08-10 NOTE — Telephone Encounter (Signed)
Chief Complaint: Poison Ivy Symptoms: Red, raise, blistery rash to hands, between fingers, wrists, inner leg, itching mild-moderate Frequency: Onset Labor Day weekend Pertinent Negatives: Patient denies other symptoms Disposition: '[]'$ ED /'[]'$ Urgent Care (no appt availability in office) / '[x]'$ Appointment(In office/virtual)/ '[]'$  [facility]/ '[]'$ Home Care/ '[x]'$ Refused Recommended Disposition /'[]'$ [facility]/ '[]'$  Follow-up with PCP Additional Notes: Patient is asking for a prescription to be called in, advised he will need an OV for evaluation, he refused saying he doesn't want to pay the co-pay that he will just ride it out to see if it gets better on it's own, he's using calamine lotion that is not helping. Advised will send this to Dr. Caryn Section and someone will call with his recommendation.      Summary: poision ivy/ oak pt is unsure   Pt called in because he has poison ivy/oak on his left hand and arm and right wrist pt says that it he has been using calamine lotion and it's no longer helping. Pt would like to know if provider could send in something that would help?      Pharmacy: [id number] [place]  Phone: [phone] Fax: [phone]  Please assist .      Reason for Disposition  Severe poison ivy, oak, or sumac reaction in the past  Answer Assessment - Initial Assessment Questions 1. APPEARANCE of RASH: "Describe the rash."      Red, raised, blistery like rash 2. LOCATION: "Where is the rash located?"  (e.g., face, genitals, hands, legs)     Hands, right arm, right wrist, between fingers, inside leg right above knee  3. SIZE: "How large is the rash?"      Quarter size and some are long and some are smaller than a quarter 4. ONSET: "When did the rash begin?"      Labor day weekend 5. ITCHING: "Does the rash itch?" If Yes, ask: "How bad is it?"   - MILD - doesn't interfere with normal activities   - MODERATE-SEVERE: interferes with  work, school, sleep, or other activities      Mild-Moderate depending on the time of the day 6. EXPOSURE:  "How were you exposed to the plant (poison ivy, poison oak, sumac)"  "When were you exposed?"       Yes 7. PAST HISTORY: "Have you had a poison ivy rash before?" If Yes, ask: "How bad was it?"     Yes, prednisone given 8. PREGNANCY: "Is there any chance you are pregnant?" "When was your last menstrual period?"     N/A  Protocols used: [facility]

## 2022-08-12 ENCOUNTER — Ambulatory Visit: Payer: BC Managed Care – PPO | Admitting: Physician Assistant

## 2022-11-13 ENCOUNTER — Telehealth: Payer: BC Managed Care – PPO | Admitting: Physician Assistant

## 2022-11-13 ENCOUNTER — Ambulatory Visit: Payer: Self-pay

## 2022-11-13 DIAGNOSIS — R058 Other specified cough: Secondary | ICD-10-CM | POA: Diagnosis not present

## 2022-11-13 MED ORDER — PREDNISONE 20 MG PO TABS: 40.0000 mg | ORAL_TABLET | Freq: Every day | ORAL | 0 refills | Status: AC

## 2022-11-13 MED ORDER — ALBUTEROL SULFATE HFA 108 (90 BASE) MCG/ACT IN AERS: 2.0000 | INHALATION_SPRAY | Freq: Four times a day (QID) | RESPIRATORY_TRACT | 0 refills | Status: DC | PRN

## 2022-11-13 MED ORDER — PROMETHAZINE-DM 6.25-15 MG/5ML PO SYRP: 5.0000 mL | ORAL_SOLUTION | Freq: Four times a day (QID) | ORAL | 0 refills | Status: DC | PRN

## 2022-11-13 NOTE — Telephone Encounter (Signed)
Patient's wife advised

## 2022-11-13 NOTE — Telephone Encounter (Signed)
I can't see him today

## 2022-11-13 NOTE — Progress Notes (Signed)
Virtual Visit Consent   Matthew Giles, you are scheduled for a virtual visit with a [facility] provider today. Just as with appointments in the office, your consent must be obtained to participate. Your consent will be active for this visit and any virtual visit you may have with one of our providers in the next 365 days. If you have a MyChart account, a copy of this consent can be sent to you electronically.  As this is a virtual visit, video technology does not allow for your provider to perform a traditional examination. This may limit your provider's ability to fully assess your condition. If your provider identifies any concerns that need to be evaluated in person or the need to arrange testing (such as labs, EKG, etc.), we will make arrangements to do so. Although advances in technology are sophisticated, we cannot ensure that it will always work on either your end or our end. If the connection with a video visit is poor, the visit may have to be switched to a telephone visit. With either a video or telephone visit, we are not always able to ensure that we have a secure connection.  By engaging in this virtual visit, you consent to the provision of healthcare and authorize for your insurance to be billed (if applicable) for the services provided during this visit. Depending on your insurance coverage, you may receive a charge related to this service.  I need to obtain your verbal consent now. Are you willing to proceed with your visit today? DOYLE KUNATH has provided verbal consent on 11/13/2022 for a virtual visit (video or telephone). Lenise Arena Ward, PA-C  Date: 11/13/2022 4:20 PM  Virtual Visit via Video Note   I, Lenise Arena Ward, connected with  Matthew Giles  ([id number], 1971/05/09) on 11/13/22 at  4:15 PM EST by a video-enabled telemedicine application and verified that I am speaking with the correct person using two identifiers.  Location: Patient: Virtual Visit Location Patient:  Home Provider: Virtual Visit Location Provider: Home Office   I discussed the limitations of evaluation and management by telemedicine and the availability of in person appointments. The patient expressed understanding and agreed to proceed.    History of Present Illness: Matthew Giles is a 51 y.o. who identifies as a male who was assigned male at birth, and is being seen today for cough that started about 4 weeks ago.  Reports initially he was experiencing fever, cough, and congestion.  Cough is now persistent.  He reports a dry cough.  Reports some shortness of breath with coughing fits.  He has been using cough drops with minimal relief.  Denies facial pain and pressure, fever, nights seats, wheezing.   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/29/2018   BMI 31.0-31.9,adult 92/42/6834   Umbilical hernia 19/62/2297    Allergies: No Known Allergies Medications:  Current Outpatient Medications:  albuterol (VENTOLIN HFA) 108 (90 Base) MCG/ACT inhaler, Inhale 2 puffs into the lungs every 6 (six) hours as needed for wheezing or shortness of breath., Disp: 8 g, Rfl: 0   predniSONE (DELTASONE) 20 MG tablet, Take 2 tablets (40 mg total) by mouth daily with breakfast for 5 days., Disp: 10 tablet, Rfl: 0   promethazine-dextromethorphan (PROMETHAZINE-DM) 6.25-15 MG/5ML syrup, Take 5 mLs by mouth 4 (four) times daily as needed for cough., Disp:  118 mL, Rfl: 0   esomeprazole (NEXIUM) 20 MG capsule, Take 20 mg by mouth daily., Disp: , Rfl:    mometasone (ELOCON) 0.1 % cream, Apply 1 Application topically daily as needed (Rash)., Disp: 45 g, Rfl: 1  Observations/Objective: Patient is well-developed, well-nourished in no acute distress.  Resting comfortably  at home.  Head is normocephalic, atraumatic.  No labored breathing.  Speech is clear and coherent with logical content.  Patient is alert and oriented at baseline.    Assessment and Plan: 1. Post-viral cough syndrome - promethazine-dextromethorphan (PROMETHAZINE-DM) 6.25-15 MG/5ML syrup; Take 5 mLs by mouth 4 (four) times daily as needed for cough.  Dispense: 118 mL; Refill: 0 - albuterol (VENTOLIN HFA) 108 (90 Base) MCG/ACT inhaler; Inhale 2 puffs into the lungs every 6 (six) hours as needed for wheezing or shortness of breath.  Dispense: 8 g; Refill: 0 - predniSONE (DELTASONE) 20 MG tablet; Take 2 tablets (40 mg total) by mouth daily with breakfast for 5 days.  Dispense: 10 tablet; Refill: 0  Initial viral illness improved, now with dry worsening cough.  Denies fever, sinus pain/pressure, night sweats.  Supportive care discussed. In person evaluation precautions discussed.   Follow Up Instructions: I discussed the assessment and treatment plan with the patient. The patient was provided an opportunity to ask questions and all were answered. The patient agreed with the plan and demonstrated an understanding of the instructions.  A copy of instructions were sent to the patient via MyChart unless otherwise noted below.     The patient was advised to call back or seek an in-person evaluation if the symptoms worsen or if the condition fails to improve as anticipated.  Time:  I spent 10 minutes with the patient via telehealth technology discussing the above problems/concerns.    Lenise Arena Ward, PA-C

## 2022-11-13 NOTE — Patient Instructions (Signed)
Matthew Giles, thank you for joining Taylor, PA-C for today's virtual visit.  While this provider is not your primary care provider (PCP), if your PCP is located in our provider database this encounter information will be shared with them immediately following your visit.   [facility] account gives you access to today's visit and all your visits, tests, and labs performed at [facility] " click here if you don't have a [facility] account or go to [email or web address]  Consent: (Patient) Matthew Giles provided verbal consent for this virtual visit at the beginning of the encounter.  Current Medications:  Current Outpatient Medications:    albuterol (VENTOLIN HFA) 108 (90 Base) MCG/ACT inhaler, Inhale 2 puffs into the lungs every 6 (six) hours as needed for wheezing or shortness of breath., Disp: 8 g, Rfl: 0   predniSONE (DELTASONE) 20 MG tablet, Take 2 tablets (40 mg total) by mouth daily with breakfast for 5 days., Disp: 10 tablet, Rfl: 0   promethazine-dextromethorphan (PROMETHAZINE-DM) 6.25-15 MG/5ML syrup, Take 5 mLs by mouth 4 (four) times daily as needed for cough., Disp: 118 mL, Rfl: 0   esomeprazole (NEXIUM) 20 MG capsule, Take 20 mg by mouth daily., Disp: , Rfl:    mometasone (ELOCON) 0.1 % cream, Apply 1 Application topically daily as needed (Rash)., Disp: 45 g, Rfl: 1   Medications ordered in this encounter:  Meds ordered this encounter  Medications   promethazine-dextromethorphan (PROMETHAZINE-DM) 6.25-15 MG/5ML syrup    Sig: Take 5 mLs by mouth 4 (four) times daily as needed for cough.    Dispense:  118 mL    Refill:  0    Order Specific Question:   Supervising Provider    Answer:   Chase Picket [[id number]]   albuterol (VENTOLIN HFA) 108 (90 Base) MCG/ACT inhaler    Sig: Inhale 2 puffs into the lungs every 6 (six) hours as needed for wheezing or shortness of breath.    Dispense:  8 g    Refill:  0    Order Specific  Question:   Supervising Provider    Answer:   Chase Picket [[id number]]   predniSONE (DELTASONE) 20 MG tablet    Sig: Take 2 tablets (40 mg total) by mouth daily with breakfast for 5 days.    Dispense:  10 tablet    Refill:  0    Order Specific Question:   Supervising Provider    Answer:   Chase Picket [id number]     *If you need refills on other medications prior to your next appointment, please contact your pharmacy*  Follow-Up: Call back or seek an in-person evaluation if the symptoms worsen or if the condition fails to improve as anticipated.  [facility] [phone]  Other Instructions Take prednisone once daily for 5 days as prescribed.  Can take cough syrup as needed for cough.  If you are experiencing shortness of breath or wheezing can use inhaler. If your symptoms become worse please follow up with Primary Care Physician or Urgent Care for in person evaluation.    If you have been instructed to have an in-person evaluation today at a local Urgent Care facility, please use the link below. It will take you to a list of all of our available [facility], including address, phone number and hours of operation. Please do not delay care.  [facility]  If you or a family member do not  have a primary care provider, use the link below to schedule a visit and establish care. When you choose a [facility] primary care physician or advanced practice provider, you gain a long-term partner in health. Find a Primary Care Provider  Learn more about [facility] in-office and virtual care options: [facility]

## 2022-11-13 NOTE — Telephone Encounter (Signed)
    Chief Complaint: Non-productive cough Symptoms: Runny nose Frequency: 4 weeks  Pertinent Negatives: Patient denies fever Disposition: '[]'$ ED /'[]'$ Urgent Care (no appt availability in office) / '[]'$ Appointment(In office/virtual)/ '[]'$  [facility]/ '[]'$ Home Care/ '[]'$ Refused Recommended Disposition /'[]'$ [facility]/ '[x]'$  Follow-up with PCP Additional Notes: Asking to be worked in this afternoon. Call wife because pt. At work and cannot answer phone.  Answer Assessment - Initial Assessment Questions 1. ONSET: "When did the cough begin?"      4 weeks ago 2. SEVERITY: "How bad is the cough today?"      Severe 3. SPUTUM: "Describe the color of your sputum" (none, dry cough; clear, white, yellow, green)     None 4. HEMOPTYSIS: "Are you coughing up any blood?" If so ask: "How much?" (flecks, streaks, tablespoons, etc.)     No 5. DIFFICULTY BREATHING: "Are you having difficulty breathing?" If Yes, ask: "How bad is it?" (e.g., mild, moderate, severe)    - MILD: No SOB at rest, mild SOB with walking, speaks normally in sentences, can lie down, no retractions, pulse < 100.    - MODERATE: SOB at rest, SOB with minimal exertion and prefers to sit, cannot lie down flat, speaks in phrases, mild retractions, audible wheezing, pulse 100-120.    - SEVERE: Very SOB at rest, speaks in single words, struggling to breathe, sitting hunched forward, retractions, pulse > 120      Mild 6. FEVER: "Do you have a fever?" If Yes, ask: "What is your temperature, how was it measured, and when did it start?"     No 7. CARDIAC HISTORY: "Do you have any history of heart disease?" (e.g., heart attack, congestive heart failure)      No 8. LUNG HISTORY: "Do you have any history of lung disease?"  (e.g., pulmonary embolus, asthma, emphysema)     No 9. PE RISK FACTORS: "Do you have a history of blood clots?" (or: recent major surgery, recent prolonged travel, bedridden)     No 10. OTHER SYMPTOMS: "Do you have any  other symptoms?" (e.g., runny nose, wheezing, chest pain)       Runny nose 11. PREGNANCY: "Is there any chance you are pregnant?" "When was your last menstrual period?"       N/A 12. TRAVEL: "Have you traveled out of the country in the last month?" (e.g., travel history, exposures)       nO  Protocols used: Cough - Acute Non-Productive-A-AH

## 2022-11-16 ENCOUNTER — Ambulatory Visit: Payer: BC Managed Care – PPO | Admitting: Physician Assistant

## 2022-12-07 ENCOUNTER — Telehealth (INDEPENDENT_AMBULATORY_CARE_PROVIDER_SITE_OTHER): Payer: BC Managed Care – PPO | Admitting: Family Medicine

## 2022-12-07 DIAGNOSIS — R052 Subacute cough: Secondary | ICD-10-CM

## 2022-12-07 MED ORDER — FLUTICASONE-SALMETEROL 250-50 MCG/ACT IN AEPB: 1.0000 | INHALATION_SPRAY | Freq: Two times a day (BID) | RESPIRATORY_TRACT | 1 refills | Status: DC

## 2022-12-07 NOTE — Patient Instructions (Addendum)
Please review the attached list of medications and notify my office if there are any errors.   Start taking Nexium every day for the next 14 days since acid reflux can aggravate chronic cough  Start taking Wixela inhaler twice a day every day  Go to the [facility] on [address] for chest Xray if you cough is not much better by the end of the week.

## 2022-12-07 NOTE — Progress Notes (Signed)
I,Matthew Giles,acting as a Education administrator for Matthew Huh, MD.,have documented all relevant documentation on the behalf of Matthew Huh, MD,as directed by  Matthew Huh, MD while in the presence of Matthew Huh, MD.    MyChart Video Visit    Virtual Visit via Video Note   This format is felt to be most appropriate for this patient at this time. Physical exam was limited by quality of the video and audio technology used for the visit.   Patient location: home  Provider location: [facility]  I discussed the limitations of evaluation and management by telemedicine and the availability of in person appointments. The patient expressed understanding and agreed to proceed.  Patient: Matthew Giles   DOB: 04-Feb-1971   52 y.o. Male  MRN: [id number] Visit Date: 12/07/2022  Today's healthcare provider: Lelon Huh, MD   No chief complaint on file.  Subjective    Cough This is a recurrent problem. The problem has been unchanged. The problem occurs every few minutes. The cough is Non-productive. The symptoms are aggravated by cold air. He has tried oral steroids (promethazine rx) for the symptoms. The treatment provided mild relief.    Started with malaise, fever and URI sx the second week of November. All sx except for cough resolved after a few days. He had virtual urgent care visit mid December.    Was diagnosed as Post-viral cough syndrome and prescribed - promethazine-dextromethorphan (PROMETHAZINE-DM) 6.25-15 MG/5ML syrup; Take 5 mLs by mouth 4 (four) times daily as needed for cough.  Dispense: 118 mL; Refill: 0 - albuterol (VENTOLIN HFA) 108 (90 Base) MCG/ACT inhaler; Inhale 2 puffs into the lungs every 6 (six) hours as needed for wheezing or shortness of breath.  Dispense: 8 g; Refill: 0 - predniSONE (DELTASONE) 20 MG tablet; Take 2 tablets (40 mg total) by mouth daily with breakfast for 5 days.  Dispense: 10 tablet; Refill: 0  He states he didn't seen any  improvement with prednisone, but Inhaler helps for a few hours.  Had been productive, but no longer coughing anything up.   Has long history of acid reflux. He used to take Nexium daily but now only takes occasionally.  Never been a regular smoker or have any asthma problems.    Medications: Outpatient Medications Prior to Visit  Medication Sig   albuterol (VENTOLIN HFA) 108 (90 Base) MCG/ACT inhaler Inhale 2 puffs into the lungs every 6 (six) hours as needed for wheezing or shortness of breath.   esomeprazole (NEXIUM) 20 MG capsule Take 20 mg by mouth daily.   mometasone (ELOCON) 0.1 % cream Apply 1 Application topically daily as needed (Rash).   promethazine-dextromethorphan (PROMETHAZINE-DM) 6.25-15 MG/5ML syrup Take 5 mLs by mouth 4 (four) times daily as needed for cough.   No facility-administered medications prior to visit.    Review of Systems  Respiratory:  Positive for cough.       Objective    There were no vitals taken for this visit.     Physical Exam   Awake, alert, oriented x 3. In no apparent distress    Assessment & Plan     1. Subacute cough Started during viral uri in November, but cough persistent after all other sx resolved. May be aggravated by chronic reflux considering he is no longer taking Nexium every day.   Will start fluticasone-salmeterol (WIXELA INHUB) 250-50 MCG/ACT AEPB; Inhale 1 puff into the lungs in the morning and at bedtime.  Dispense: 60 each; Refill: 1  Is to start back on Nexium every day until resolved.  If cough is not much better in 4-5 days he is to go to [facility] for chest XR.  .     I discussed the assessment and treatment plan with the patient. The patient was provided an opportunity to ask questions and all were answered. The patient agreed with the plan and demonstrated an understanding of the instructions.   The patient was advised to call back or seek an in-person evaluation if the symptoms worsen or if the condition  fails to improve as anticipated.  I provided 12 minutes of non-face-to-face time during this encounter.  The entirety of the information documented in the History of Present Illness, Review of Systems and Physical Exam were personally obtained by me. Portions of this information were initially documented by the CMA and reviewed by me for thoroughness and accuracy.    Matthew Huh, MD [facility] [phone] (phone) [phone] (fax)  [place]

## 2023-01-21 NOTE — Progress Notes (Unsigned)
Argentina Ponder [place],acting as a scribe for Lelon Huh, MD.,have documented all relevant documentation on the behalf of Lelon Huh, MD,as directed by  Lelon Huh, MD while in the presence of Lelon Huh, MD.    Complete physical exam   Patient: Matthew Giles   DOB: 05/13/1971   52 y.o. Male  MRN: [id number] Visit Date: 01/22/2023  Today's healthcare provider: Lelon Huh, MD   Chief Complaint  Patient presents with   Annual Exam   Subjective    Matthew Giles is a 52 y.o. male who presents today for a complete physical exam.  He reports consuming a general diet. The patient does not participate in regular exercise at present. He generally feels well. He reports sleeping well. He does not have additional problems to discuss today.  HPI    Past Medical History:  Diagnosis Date   GERD (gastroesophageal reflux disease)    Past Surgical History:  Procedure Laterality Date   COLONOSCOPY WITH PROPOFOL N/A 01/24/2021   Procedure: COLONOSCOPY WITH PROPOFOL;  Surgeon: Lucilla Lame, MD;  Location: [place];  Service: Endoscopy;  Laterality: N/A;  priority 4 COVID + 12-09-20   HERNIA REPAIR  April [id number]   umbilical Dr Jamal Collin   POLYPECTOMY  01/24/2021   Procedure: POLYPECTOMY;  Surgeon: Lucilla Lame, MD;  Location: [facility];  Service: Endoscopy;;   Social History   Socioeconomic History   Marital status: Married    Spouse name: Not on file   Number of children: 2   Years of education: Not on file   Highest education level: Not on file  Occupational History   Occupation: Sales  Tobacco Use   Smoking status: Never   Smokeless tobacco: Never  Vaping Use   Vaping Use: Never used  Substance and Sexual Activity   Alcohol use: No   Drug use: No   Sexual activity: Not on file  Other Topics Concern   Not on file  Social History Narrative   Has two sons and a step son   Social Determinants of Health   Financial Resource Strain: Not on file   Food Insecurity: Not on file  Transportation Needs: Not on file  Physical Activity: Not on file  Stress: Not on file  Social Connections: Not on file  Intimate Partner Violence: Not on file   Family Status  Relation Name Status   Mother  Alive   Father  Alive   Son  Alive   Son  Alive   Neg Hx  (Not Specified)   Family History  Problem Relation Age of Onset   Diabetes Mother    Cancer Father        side of neck   Colon cancer Neg Hx    Prostate cancer Neg Hx    No Known Allergies  Patient Care Team: Birdie Sons, MD as PCP - General (Family Medicine) Christene Lye, MD (General Surgery)   Medications: Outpatient Medications Prior to Visit  Medication Sig   Ascorbic Acid (VITAMIN C ADULT GUMMIES PO) Take 250 mg by mouth daily.   mometasone (ELOCON) 0.1 % cream Apply 1 Application topically daily as needed (Rash).   Multiple Vitamins-Minerals (MEGA MULTIVITAMIN FOR MEN) TABS Take by mouth daily.   [DISCONTINUED] esomeprazole (NEXIUM) 20 MG capsule Take 20 mg by mouth daily.   [DISCONTINUED] albuterol (VENTOLIN HFA) 108 (90 Base) MCG/ACT inhaler Inhale 2 puffs into the lungs every 6 (six) hours as needed for wheezing or shortness of  breath. (Patient not taking: Reported on 12/07/2022)   [DISCONTINUED] fluticasone-salmeterol (WIXELA INHUB) 250-50 MCG/ACT AEPB Inhale 1 puff into the lungs in the morning and at bedtime.   [DISCONTINUED] promethazine-dextromethorphan (PROMETHAZINE-DM) 6.25-15 MG/5ML syrup Take 5 mLs by mouth 4 (four) times daily as needed for cough.   No facility-administered medications prior to visit.    Review of Systems  Constitutional: Negative.   HENT: Negative.    Eyes: Negative.   Respiratory: Negative.    Cardiovascular: Negative.   Gastrointestinal: Negative.   Endocrine: Negative.   Genitourinary: Negative.   Musculoskeletal: Negative.   Skin: Negative.   Allergic/Immunologic: Negative.   Neurological: Negative.   Hematological:  Negative.   Psychiatric/Behavioral: Negative.      {Labs  Heme  Chem  Endocrine  Serology  Results Review (optional):23779}  Objective    BP (!) 132/100 (BP Location: Right Arm, Patient Position: Sitting, Cuff Size: Normal)   Pulse 78   Temp 98.4 F (36.9 C) (Oral)   Wt 219 lb (99.3 kg)   SpO2 100%   BMI 33.30 kg/m  Vitals:   01/22/23 1415 01/22/23 1422  BP: (!) 132/100 (!) 133/95  Pulse: 78   Temp: 98.4 F (36.9 C)   TempSrc: Oral   SpO2: 100%   Weight: 219 lb (99.3 kg)   '  Physical Exam  ***  Last depression screening scores    01/22/2023    2:17 PM 01/16/2022    9:03 AM 12/27/2020    2:15 PM  PHQ 2/9 Scores  PHQ - 2 Score 0 0 0  PHQ- 9 Score 0 0 0   Last fall risk screening    01/22/2023    2:17 PM  [place] in the past year? 0  Number falls in past yr: 0  Injury with Fall? 0   Last Audit-C alcohol use screening    01/22/2023    2:17 PM  Alcohol Use Disorder Test (AUDIT)  1. How often do you have a drink containing alcohol? 0  2. How many drinks containing alcohol do you have on a typical day when you are drinking? 0  3. How often do you have six or more drinks on one occasion? 0  AUDIT-C Score 0   A score of 3 or more in women, and 4 or more in men indicates increased risk for alcohol abuse, EXCEPT if all of the points are from question 1   No results found for any visits on 01/22/23.  Assessment & Plan    Routine Health Maintenance and Physical Exam  Exercise Activities and Dietary recommendations  Goals   None     Immunization History  Administered Date(s) Administered   Moderna Sars-Covid-2 Vaccination 01/26/2020, 02/28/2020   Tdap 04/05/2013    Health Maintenance  Topic Date Due   Zoster Vaccines- Shingrix (1 of 2) Never done   COVID-19 Vaccine (3 - Moderna risk series) 02/07/2023 (Originally 03/27/2020)   INFLUENZA VACCINE  02/28/2023 (Originally 06/30/2022)   DTaP/Tdap/Td (2 - Td or Tdap) 04/06/2023   COLONOSCOPY (Pts  45-59yr Insurance coverage will need to be confirmed)  01/24/2026   Hepatitis C Screening  Completed   HIV Screening  Completed   HPV VACCINES  Aged Out    Discussed health benefits of physical activity, and encouraged him to engage in regular exercise appropriate for his age and condition.  ***  No follow-ups on file.     {provider attestation***:1}   DLelon Huh MD  [facility]  [place] [phone] (phone) [phone] (fax)  [place]

## 2023-01-22 ENCOUNTER — Ambulatory Visit (INDEPENDENT_AMBULATORY_CARE_PROVIDER_SITE_OTHER): Payer: BC Managed Care – PPO | Admitting: Family Medicine

## 2023-01-22 VITALS — BP 133/95 | HR 78 | Temp 98.4°F | Wt 219.0 lb

## 2023-01-22 DIAGNOSIS — E662 Morbid (severe) obesity with alveolar hypoventilation: Secondary | ICD-10-CM

## 2023-01-22 DIAGNOSIS — Z6833 Body mass index (BMI) 33.0-33.9, adult: Secondary | ICD-10-CM

## 2023-01-22 DIAGNOSIS — R030 Elevated blood-pressure reading, without diagnosis of hypertension: Secondary | ICD-10-CM | POA: Diagnosis not present

## 2023-01-22 DIAGNOSIS — Z23 Encounter for immunization: Secondary | ICD-10-CM | POA: Diagnosis not present

## 2023-01-22 DIAGNOSIS — Z125 Encounter for screening for malignant neoplasm of prostate: Secondary | ICD-10-CM | POA: Diagnosis not present

## 2023-01-22 DIAGNOSIS — Z0000 Encounter for general adult medical examination without abnormal findings: Secondary | ICD-10-CM | POA: Diagnosis not present

## 2023-01-22 DIAGNOSIS — Z136 Encounter for screening for cardiovascular disorders: Secondary | ICD-10-CM | POA: Diagnosis not present

## 2023-01-22 NOTE — Patient Instructions (Addendum)
Please review the attached list of medications and notify my office if there are any errors.   Last recorded height was '5\' 8"'$ . Weight goal for a BMI of 30 is 197.3 pounds.

## 2023-01-23 LAB — COMPREHENSIVE METABOLIC PANEL
ALT: 29 IU/L (ref 0–44)
AST: 19 IU/L (ref 0–40)
Albumin/Globulin Ratio: 2.2 (ref 1.2–2.2)
Albumin: 4.7 g/dL (ref 3.8–4.9)
Alkaline Phosphatase: 72 IU/L (ref 44–121)
BUN/Creatinine Ratio: 25 — ABNORMAL HIGH (ref 9–20)
BUN: 24 mg/dL (ref 6–24)
Bilirubin Total: 0.3 mg/dL (ref 0.0–1.2)
CO2: 23 mmol/L (ref 20–29)
Calcium: 10 mg/dL (ref 8.7–10.2)
Chloride: 101 mmol/L (ref 96–106)
Creatinine, Ser: 0.95 mg/dL (ref 0.76–1.27)
Globulin, Total: 2.1 g/dL (ref 1.5–4.5)
Glucose: 86 mg/dL (ref 70–99)
Potassium: 4 mmol/L (ref 3.5–5.2)
Sodium: 142 mmol/L (ref 134–144)
Total Protein: 6.8 g/dL (ref 6.0–8.5)
eGFR: 96 mL/min/{1.73_m2} (ref >59)

## 2023-01-23 LAB — LIPID PANEL WITH LDL/HDL RATIO
Cholesterol, Total: 214 mg/dL — ABNORMAL HIGH (ref 100–199)
HDL: 46 mg/dL (ref >39)
LDL Chol Calc (NIH): 133 mg/dL — ABNORMAL HIGH (ref 0–99)
LDL/HDL Ratio: 2.9 ratio (ref 0.0–3.6)
Triglycerides: 194 mg/dL — ABNORMAL HIGH (ref 0–149)
VLDL Cholesterol Cal: 35 mg/dL (ref 5–40)

## 2023-01-23 LAB — CBC WITH DIFFERENTIAL/PLATELET
Basophils Absolute: 0.1 10*3/uL (ref 0.0–0.2)
Basos: 1 %
EOS (ABSOLUTE): 0.1 10*3/uL (ref 0.0–0.4)
Eos: 2 %
Hematocrit: 39.6 % (ref 37.5–51.0)
Hemoglobin: 14.4 g/dL (ref 13.0–17.7)
Immature Grans (Abs): 0 10*3/uL (ref 0.0–0.1)
Immature Granulocytes: 0 %
Lymphocytes Absolute: 3.6 10*3/uL — ABNORMAL HIGH (ref 0.7–3.1)
Lymphs: 48 %
MCH: 32.3 pg (ref 26.6–33.0)
MCHC: 36.4 g/dL — ABNORMAL HIGH (ref 31.5–35.7)
MCV: 89 fL (ref 79–97)
Monocytes Absolute: 0.8 10*3/uL (ref 0.1–0.9)
Monocytes: 10 %
Neutrophils Absolute: 2.9 10*3/uL (ref 1.4–7.0)
Neutrophils: 39 %
Platelets: 280 10*3/uL (ref 150–450)
RBC: 4.46 x10E6/uL (ref 4.14–5.80)
RDW: 11.9 % (ref 11.6–15.4)
WBC: 7.5 10*3/uL (ref 3.4–10.8)

## 2023-01-23 LAB — TSH: TSH: 1.96 u[IU]/mL (ref 0.450–4.500)

## 2023-01-23 LAB — PSA: Prostate Specific Ag, Serum: 0.7 ng/mL (ref 0.0–4.0)

## 2023-03-03 ENCOUNTER — Ambulatory Visit: Payer: BC Managed Care – PPO | Admitting: Dermatology

## 2023-03-10 ENCOUNTER — Ambulatory Visit: Payer: BC Managed Care – PPO | Admitting: Dermatology

## 2023-03-10 VITALS — BP 129/90

## 2023-03-10 DIAGNOSIS — L820 Inflamed seborrheic keratosis: Secondary | ICD-10-CM | POA: Diagnosis not present

## 2023-03-10 DIAGNOSIS — L578 Other skin changes due to chronic exposure to nonionizing radiation: Secondary | ICD-10-CM | POA: Diagnosis not present

## 2023-03-10 DIAGNOSIS — L821 Other seborrheic keratosis: Secondary | ICD-10-CM

## 2023-03-10 DIAGNOSIS — L918 Other hypertrophic disorders of the skin: Secondary | ICD-10-CM

## 2023-03-10 NOTE — Patient Instructions (Addendum)
Cryotherapy Aftercare  Wash gently with soap and water everyday.   Apply Vaseline and Band-Aid daily until healed.     Due to recent changes in healthcare laws, you may see results of your pathology and/or laboratory studies on MyChart before the doctors have had a chance to review them. We understand that in some cases there may be results that are confusing or concerning to you. Please understand that not all results are received at the same time and often the doctors may need to interpret multiple results in order to provide you with the best plan of care or course of treatment. Therefore, we ask that you please give us 2 business days to thoroughly review all your results before contacting the office for clarification. Should we see a critical lab result, you will be contacted sooner.   If You Need Anything After Your Visit  If you have any questions or concerns for your doctor, please call our main line at [phone] and press option 4 to reach your doctor's medical assistant. If no one answers, please leave a voicemail as directed and we will return your call as soon as possible. Messages left after 4 pm will be answered the following business day.   You may also send us a message via MyChart. We typically respond to MyChart messages within 1-2 business days.  For prescription refills, please ask your pharmacy to contact our office. Our fax number is [phone].  If you have an urgent issue when the clinic is closed that cannot wait until the next business day, you can page your doctor at the number below.    Please note that while we do our best to be available for urgent issues outside of office hours, we are not available 24/7.   If you have an urgent issue and are unable to reach us, you may choose to seek medical care at your doctor's office, retail clinic, urgent care center, or emergency room.  If you have a medical emergency, please immediately call 911 or go to the  emergency department.  Pager Numbers  - Dr. Kowalski: [phone]  - Dr. Moye: [phone]  - Dr. Stewart: [phone]  In the event of inclement weather, please call our main line at [phone] for an update on the status of any delays or closures.  Dermatology Medication Tips: Please keep the boxes that topical medications come in in order to help keep track of the instructions about where and how to use these. Pharmacies typically print the medication instructions only on the boxes and not directly on the medication tubes.   If your medication is too expensive, please contact our office at [phone] option 4 or send us a message through MyChart.   We are unable to tell what your co-pay for medications will be in advance as this is different depending on your insurance coverage. However, we may be able to find a substitute medication at lower cost or fill out paperwork to get insurance to cover a needed medication.   If a prior authorization is required to get your medication covered by your insurance company, please allow us 1-2 business days to complete this process.  Drug prices often vary depending on where the prescription is filled and some pharmacies may offer cheaper prices.  The website www.goodrx.com contains coupons for medications through different pharmacies. The prices here do not account for what the cost may be with help from insurance (it may be cheaper with your insurance), but the website can   give you the price if you did not use any insurance.  - You can print the associated coupon and take it with your prescription to the pharmacy.  - You may also stop by our office during regular business hours and pick up a GoodRx coupon card.  - If you need your prescription sent electronically to a different pharmacy, notify our office through [facility] MyChart or by phone at [phone] option 4.     Si Usted Necesita Algo Despus de Su Visita  Tambin puede  enviarnos un mensaje a travs de MyChart. Por lo general respondemos a los mensajes de MyChart en el transcurso de 1 a 2 das hbiles.  Para renovar recetas, por favor pida a su farmacia que se ponga en contacto con nuestra oficina. Nuestro nmero de fax es el [phone].  Si tiene un asunto urgente cuando la clnica est cerrada y que no puede esperar hasta el siguiente da hbil, puede llamar/localizar a su doctor(a) al nmero que aparece a continuacin.   Por favor, tenga en cuenta que aunque hacemos todo lo posible para estar disponibles para asuntos urgentes fuera del horario de oficina, no estamos disponibles las 24 horas del da, los 7 das de la semana.   Si tiene un problema urgente y no puede comunicarse con nosotros, puede optar por buscar atencin mdica  en el consultorio de su doctor(a), en una clnica privada, en un centro de atencin urgente o en una sala de emergencias.  Si tiene una emergencia mdica, por favor llame inmediatamente al 911 o vaya a la sala de emergencias.  Nmeros de bper  - Dr. Kowalski: [phone]  - Dra. Moye: [phone]  - Dra. Stewart: [phone]  En caso de inclemencias del tiempo, por favor llame a nuestra lnea principal al [phone] para una actualizacin sobre el estado de cualquier retraso o cierre.  Consejos para la medicacin en dermatologa: Por favor, guarde las cajas en las que vienen los medicamentos de uso tpico para ayudarle a seguir las instrucciones sobre dnde y cmo usarlos. Las farmacias generalmente imprimen las instrucciones del medicamento slo en las cajas y no directamente en los tubos del medicamento.   Si su medicamento es muy caro, por favor, pngase en contacto con nuestra oficina llamando al [phone] y presione la opcin 4 o envenos un mensaje a travs de MyChart.   No podemos decirle cul ser su copago por los medicamentos por adelantado ya que esto es diferente dependiendo de la cobertura de su seguro.  Sin embargo, es posible que podamos encontrar un medicamento sustituto a menor costo o llenar un formulario para que el seguro cubra el medicamento que se considera necesario.   Si se requiere una autorizacin previa para que su compaa de seguros cubra su medicamento, por favor permtanos de 1 a 2 das hbiles para completar este proceso.  Los precios de los medicamentos varan con frecuencia dependiendo del lugar de dnde se surte la receta y alguna farmacias pueden ofrecer precios ms baratos.  El sitio web www.goodrx.com tiene cupones para medicamentos de diferentes farmacias. Los precios aqu no tienen en cuenta lo que podra costar con la ayuda del seguro (puede ser ms barato con su seguro), pero el sitio web puede darle el precio si no utiliz ningn seguro.  - Puede imprimir el cupn correspondiente y llevarlo con su receta a la farmacia.  - Tambin puede pasar por nuestra oficina durante el horario de atencin regular y recoger una tarjeta de cupones de GoodRx.  -   Si necesita que su receta se enve electrnicamente a una farmacia diferente, informe a nuestra oficina a travs de MyChart de [place] o por telfono llamando al [phone] y presione la opcin 4.  

## 2023-03-10 NOTE — Progress Notes (Signed)
Follow-Up Visit   Subjective  Matthew Giles is a 52 y.o. male who presents for the following: skin tags bil axilla, neck, eyelids, R upper thigh The patient has spots, moles and lesions to be evaluated, some may be new or changing and the patient has concerns that these could be cancer.  The following portions of the chart were reviewed this encounter and updated as appropriate: medications, allergies, medical history  Review of Systems:  No other skin or systemic complaints except as noted in HPI or Assessment and Plan.  Objective  Well appearing patient in no apparent distress; mood and affect are within normal limits.  A focused examination was performed of the following areas: Face, neck, bil axilla, R leg Relevant exam findings are noted in the Assessment and Plan.   Assessment & Plan   ACROCHORDONS (Skin Tags) - Removal desired by patient - Fleshy, skin-colored pedunculated papules - Benign appearing.  - Patient desires removal. Reviewed that this is not covered by insurance and they will be charged a cosmetic fee for removal. Patient signed non-covered consent.  Procedure Note: - Prior to the procedure, reviewed the expected small wound. Also reviewed the risk of leaving a small scar and the small risk of infection.  PROCEDURE - The areas were prepped with isopropyl alcohol. A small amount of lidocaine 1% with epinephrine was injected at the base of each lesion to achieve good local anesthesia. The skin tags were removed using a snip technique. Aluminum chloride was used for hemostasis. Petrolatum and a bandage were applied. The procedure was tolerated well. - Wound care was reviewed with the patient. They were advised to call with any concerns.  - Locations: bil axilla, R upper medial thigh - Total number of treated acrochordons 13    Destruction Procedure Note Destruction method: cryotherapy   Informed consent: discussed and consent obtained   Lesion destroyed using  liquid nitrogen: Yes   Outcome: patient tolerated procedure well with no complications   Post-procedure details: wound care instructions given   Locations: neck x 2 # of Lesions Treated: 2  Prior to procedure, discussed risks of blister formation, small wound, skin dyspigmentation, or rare scar following cryotherapy. Recommend Vaseline ointment to treated areas while healing.   INFLAMED SEBORRHEIC KERATOSIS Exam: Erythematous keratotic or waxy stuck-on papule or plaque.  Symptomatic, irritating, patient would like treated.  Benign-appearing.  Call clinic for new or changing lesions.   Prior to procedure, discussed risks of blister formation, small wound, skin dyspigmentation, or rare scar following treatment. Recommend Vaseline ointment to treated areas while healing.  Destruction Procedure Note Destruction method: cryotherapy   Informed consent: discussed and consent obtained   Lesion destroyed using liquid nitrogen: Yes   Outcome: patient tolerated procedure well with no complications   Post-procedure details: wound care instructions given   Locations: L upper eyelid x 1, L lower eyelid x 1, R upper eyelid x 4, R cheek x 2 # of Lesions Treated: 8  ACTINIC DAMAGE - chronic, secondary to cumulative UV radiation exposure/sun exposure over time - diffuse scaly erythematous macules with underlying dyspigmentation - Recommend daily broad spectrum sunscreen SPF 30+ to sun-exposed areas, reapply every 2 hours as needed.  - Recommend staying in the shade or wearing long sleeves, sun glasses (UVA+UVB protection) and wide brim hats (4-inch brim around the entire circumference of the hat). - Call for new or changing lesions.  SEBORRHEIC KERATOSIS - Stuck-on, waxy, tan-brown papules and/or plaques  - Benign-appearing - Discussed  benign etiology and prognosis. - Observe - Call for any changes  Return if symptoms worsen or fail to improve.  I, Ardis Rowan, RMA, am acting as scribe for  Armida Sans, MD .  Documentation: I have reviewed the above documentation for accuracy and completeness, and I agree with the above.  Armida Sans, MD

## 2023-03-26 ENCOUNTER — Encounter: Payer: Self-pay | Admitting: Dermatology

## 2023-07-24 ENCOUNTER — Telehealth: Payer: BC Managed Care – PPO | Admitting: Nurse Practitioner

## 2023-07-24 DIAGNOSIS — L237 Allergic contact dermatitis due to plants, except food: Secondary | ICD-10-CM

## 2023-07-24 MED ORDER — PREDNISONE 10 MG (21) PO TBPK: ORAL_TABLET | ORAL | 0 refills | Status: DC

## 2023-07-24 NOTE — Progress Notes (Signed)
E-Visit for Poison Ivy  We are sorry that you are not feeing well.  Here is how we plan to help!  Based on what you have shared with me it looks like you have had an allergic reaction to the oily resin from a group of plants.  This resin is very sticky, so it easily attaches to your skin, clothing, tools equipment, and pet's fur.    This blistering rash is often called poison ivy rash although it can come from contact with the leaves, stems and roots of poison ivy, poison oak and poison sumac.  The oily resin contains urushiol (u-ROO-she-ol) that produces a skin rash on exposed skin.  The severity of the rash depends on the amount of urushiol that gets on your skin.  A section of skin with more urushiol on it may develop a rash sooner.  The rash usually develops 12-48 hours after exposure and can last two to three weeks.  Your skin must come in direct contact with the plant's oil to be affected.  Blister fluid doesn't spread the rash.  However, if you come into contact with a piece of clothing or pet fur that has urushiol on it, the rash may spread out.  You can also transfer the oil to other parts of your body with your fingers.  Often the rash looks like a straight line because of the way the plant brushes against your skin.  Since your rash is widespread or has resulted in a large number of blisters, I have prescribed an oral corticosteroid.  Please follow these recommendations:  I have sent a prednisone dose pack to your chosen pharmacy. Be sure to follow the instructions carefully and complete the entire prescription. You may use Benadryl or Caladryl topical lotions to sooth the itch and remember cool, not hot, showers and baths can help relieve the itching!  Place cool, wet compresses on the affected area for 15-30 minutes several times a day.  You may also take oral antihistamines, such as diphenhydramine (Benadryl, others), which may also help you sleep better.  Watch your skin for any purulent  (pus) drainage or red streaking from the site.  If this occurs, contact your provider.  You may require an antibiotic for a skin infection.  Make sure that the clothes you were wearing as well as any towels or sheets that may have come in contact with the oil (urushiol) are washed in detergent and hot water.       I have developed the following plan to treat your condition I am prescribing a two week course of steroids (37 tablets of 10 mg prednisone).  Days 1-4 take 4 tablets (40 mg) daily  Days 5-8 take 3 tablets (30 mg) daily, Days 9-11 take 2 tablets (20 mg) daily, Days 12-14 take 1 tablet (10 mg) daily.    What can you do to prevent this rash?  Avoid the plants.  Learn how to identify poison ivy, poison oak and poison sumac in all seasons.  When hiking or engaging in other activities that might expose you to these plants, try to stay on cleared pathways.  If camping, make sure you pitch your tent in an area free of these plants.  Keep pets from running through wooded areas so that urushiol doesn't accidentally stick to their fur, which you may touch.  Remove or kill the plants.  In your yard, you can get rid of poison ivy by applying an herbicide or pulling it out of   the ground, including the roots, while wearing heavy gloves.  Afterward remove the gloves and thoroughly wash them and your hands.  Don't burn poison ivy or related plants because the urushiol can be carried by smoke.  Wear protective clothing.  If needed, protect your skin by wearing socks, boots, pants, long sleeves and vinyl gloves.  Wash your skin right away.  Washing off the oil with soap and water within 30 minutes of exposure may reduce your chances of getting a poison ivy rash.  Even washing after an hour or so can help reduce the severity of the rash.  If you walk through some poison ivy and then later touch your shoes, you may get some urushiol on your hands, which may then transfer to your face or body by touching or  rubbing.  If the contaminated object isn't cleaned, the urushiol on it can still cause a skin reaction years later.    Be careful not to reuse towels after you have washed your skin.  Also carefully wash clothing in detergent and hot water to remove all traces of the oil.  Handle contaminated clothing carefully so you don't transfer the urushiol to yourself, furniture, rugs or appliances.  Remember that pets can carry the oil on their fur and paws.  If you think your pet may be contaminated with urushiol, put on some long rubber gloves and give your pet a bath.  Finally, be careful not to burn these plants as the smoke can contain traces of the oil.  Inhaling the smoke may result in difficulty breathing. If that occurred you should see a physician as soon as possible.  See your doctor right away if:  The reaction is severe or widespread You inhaled the smoke from burning poison ivy and are having difficulty breathing Your skin continues to swell The rash affects your eyes, mouth or genitals Blisters are oozing pus You develop a fever greater than 100 F (37.8 C) The rash doesn't get better within a few weeks.  If you scratch the poison ivy rash, bacteria under your fingernails may cause the skin to become infected.  See your doctor if pus starts oozing from the blisters.  Treatment generally includes antibiotics.  Poison ivy treatments are usually limited to self-care methods.  And the rash typically goes away on its own in two to three weeks.     If the rash is widespread or results in a large number of blisters, your doctor may prescribe an oral corticosteroid, such as prednisone.  If a bacterial infection has developed at the rash site, your doctor may give you a prescription for an oral antibiotic.  MAKE SURE YOU  Understand these instructions. Will watch your condition. Will get help right away if you are not doing well or get worse.   Thank you for choosing an e-visit.  Your  e-visit answers were reviewed by a board certified advanced clinical practitioner to complete your personal care plan. Depending upon the condition, your plan could have included both over the counter or prescription medications.  Please review your pharmacy choice. Make sure the pharmacy is open so you can pick up prescription now. If there is a problem, you may contact your provider through MyChart messaging and have the prescription routed to another pharmacy.  Your safety is important to us. If you have drug allergies check your prescription carefully.   For the next 24 hours you can use MyChart to ask questions about today's visit, request a non-urgent   call back, or ask for a work or school excuse. You will get an email in the next two days asking about your experience. I hope that your e-visit has been valuable and will speed your recovery.   Mary-Margaret Martin, FNP   5-10 minutes spent reviewing and documenting in chart.  

## 2024-01-28 ENCOUNTER — Encounter: Payer: Self-pay | Admitting: Family Medicine

## 2024-01-28 ENCOUNTER — Ambulatory Visit (INDEPENDENT_AMBULATORY_CARE_PROVIDER_SITE_OTHER): Payer: Self-pay | Admitting: Family Medicine

## 2024-01-28 VITALS — BP 139/88 | HR 67 | Resp 16 | Ht 68.0 in | Wt 216.0 lb

## 2024-01-28 DIAGNOSIS — Z0001 Encounter for general adult medical examination with abnormal findings: Secondary | ICD-10-CM

## 2024-01-28 DIAGNOSIS — Z0000 Encounter for general adult medical examination without abnormal findings: Secondary | ICD-10-CM

## 2024-01-28 DIAGNOSIS — M25512 Pain in left shoulder: Secondary | ICD-10-CM | POA: Diagnosis not present

## 2024-01-28 DIAGNOSIS — Z125 Encounter for screening for malignant neoplasm of prostate: Secondary | ICD-10-CM

## 2024-01-28 NOTE — Patient Instructions (Addendum)
Please review the attached list of medications and notify my office if there are any errors.   I recommend that you get the Prevnar 20 vaccine to protect yourself from certain dangerous strains of pneumonia. You can get Prevnar 20 at your pharmacy, or call our office at [phone] at your earliest convenience to schedule this vaccine.   The CDC recommends two doses of Shingrix (the shingles vaccine) separated by 2 to 6 months for adults age 53 years and older. I recommend checking with your insurance plan regarding coverage for this vaccine.    

## 2024-01-29 LAB — COMPREHENSIVE METABOLIC PANEL
ALT: 33 IU/L (ref 0–44)
AST: 21 IU/L (ref 0–40)
Albumin: 4.6 g/dL (ref 3.8–4.9)
Alkaline Phosphatase: 76 IU/L (ref 44–121)
BUN/Creatinine Ratio: 20 (ref 9–20)
BUN: 21 mg/dL (ref 6–24)
Bilirubin Total: 0.5 mg/dL (ref 0.0–1.2)
CO2: 26 mmol/L (ref 20–29)
Calcium: 9.8 mg/dL (ref 8.7–10.2)
Chloride: 101 mmol/L (ref 96–106)
Creatinine, Ser: 1.03 mg/dL (ref 0.76–1.27)
Globulin, Total: 2.5 g/dL (ref 1.5–4.5)
Glucose: 79 mg/dL (ref 70–99)
Potassium: 4.2 mmol/L (ref 3.5–5.2)
Sodium: 139 mmol/L (ref 134–144)
Total Protein: 7.1 g/dL (ref 6.0–8.5)
eGFR: 87 mL/min/{1.73_m2} (ref >59)

## 2024-01-29 LAB — LIPID PANEL WITH LDL/HDL RATIO
Cholesterol, Total: 179 mg/dL (ref 100–199)
HDL: 47 mg/dL (ref >39)
LDL Chol Calc (NIH): 110 mg/dL — ABNORMAL HIGH (ref 0–99)
LDL/HDL Ratio: 2.3 ratio (ref 0.0–3.6)
Triglycerides: 125 mg/dL (ref 0–149)
VLDL Cholesterol Cal: 22 mg/dL (ref 5–40)

## 2024-01-29 LAB — PSA TOTAL (REFLEX TO FREE): Prostate Specific Ag, Serum: 0.8 ng/mL (ref 0.0–4.0)

## 2024-01-30 ENCOUNTER — Encounter: Payer: Self-pay | Admitting: Family Medicine

## 2024-02-09 NOTE — Progress Notes (Addendum)
 Complete Physical Exam   Patient: Matthew Giles   DOB: 1971/11/02   53 y.o. Male  MRN: [id number] Visit Date: 01/28/2024  Today's healthcare provider: Mila Merry, MD   Chief Complaint  Patient presents with   Annual Exam   Subjective    Discussed the use of AI scribe software for clinical note transcription with the patient, who gave verbal consent to proceed.  History of Present Illness   Matthew Giles is a 53 year old male who presents for an annual physical exam. He is concerned about his shoulder pain. He has been experiencing pain in the shoulder and lower neck area for the past two to three days. The pain is described as a tightness and is localized to the shoulder without radiation. He has been using Biofreeze patches at night for relief and suspects it might be a pulled muscle. No chest pain, heart flutters, or shortness of breath are associated with the pain.  He is considering the shingles vaccine but is concerned about potential side effects due to upcoming travel plans. He has not received the flu shot and has never had one before. He is also considering the pneumonia vaccine, which is recommended for individuals over 50.  His cholesterol was previously noted to be slightly elevated at 214 mg/dL, and he is due for a cholesterol check. He had a colonoscopy approximately two years ago and is up to date with his tetanus vaccination.       Medications: Outpatient Medications Prior to Visit  Medication Sig   Ascorbic Acid (VITAMIN C ADULT GUMMIES PO) Take 250 mg by mouth daily.   mometasone (ELOCON) 0.1 % cream Apply 1 Application topically daily as needed (Rash).   Multiple Vitamins-Minerals (MEGA MULTIVITAMIN FOR MEN) TABS Take by mouth daily.   [DISCONTINUED] predniSONE (STERAPRED UNI-PAK 21 TAB) 10 MG (21) TBPK tablet Days 1-4 take 4 tablets (40 mg) daily  Days 5-8 take 3 tablets (30 mg) daily, Days 9-11 take 2 tablets (20 mg) daily, Days 12-14 take 1 tablet (10  mg) daily.   No facility-administered medications prior to visit.   Review of Systems  Constitutional:  Negative for appetite change, chills and fever.  Respiratory:  Negative for chest tightness, shortness of breath and wheezing.   Cardiovascular:  Negative for chest pain and palpitations.  Gastrointestinal:  Negative for abdominal pain, nausea and vomiting.      Objective    BP 139/88 (BP Location: Left Arm, Patient Position: Sitting, Cuff Size: Normal)   Pulse 67   Resp 16   Ht 5\' 8"  (1.727 m)   Wt 216 lb (98 kg)   SpO2 100%   BMI 32.84 kg/m   Physical Exam   General Appearance:    Mildly obese male. Alert, cooperative, in no acute distress, appears stated age  Head:    Normocephalic, without obvious abnormality, atraumatic  Eyes:    PERRL, conjunctiva/corneas clear, EOM's intact, fundi    benign, both eyes       Ears:    Normal TM's and external ear canals, both ears  Nose:   Nares normal, septum midline, mucosa normal, no drainage   or sinus tenderness  Throat:   Lips, mucosa, and tongue normal; teeth and gums normal  Neck:   Supple, symmetrical, trachea midline, no adenopathy;       thyroid:  No enlargement/tenderness/nodules; no carotid   bruit or JVD  Back:     Symmetric, no curvature, ROM  normal, no CVA tenderness  Lungs:     Clear to auscultation bilaterally, respirations unlabored  Chest wall:    No tenderness or deformity  Heart:    Normal heart rate. Normal rhythm. No murmurs, rubs, or gallops.  S1 and S2 normal  Abdomen:     Soft, non-tender, bowel sounds active all four quadrants,    no masses, no organomegaly  Genitalia:    deferred  Rectal:    deferred  Extremities:   All extremities are intact. No cyanosis or edema. FROM neck an shoulder with some tightness at limits of neck ROM. No gross deformities. +5/5 muscle strength.   Pulses:   2+ and symmetric all extremities  Skin:   Skin color, texture, turgor normal, no rashes or lesions  Lymph nodes:    Cervical, supraclavicular, and axillary nodes normal  Neurologic:   CNII-XII intact. Normal strength, sensation and reflexes      throughout         Assessment & Plan       Shoulder Pain Recent onset of pain in the lower neck and shoulder area, likely musculoskeletal in nature. No signs of cardiac or pulmonary issues on examination. -Continue use of Biofreeze for symptomatic relief.  General Health Maintenance Discussed the shingles vaccine, patient to consider after upcoming event due to potential side effects. Also discussed the pneumonia vaccine (Prevnar), patient to consider. -Reminder for shingles and pneumonia vaccines on discharge information.  Hyperlipidemia Last cholesterol level slightly elevated (214, ideal <200). -Check cholesterol level with today's blood work.  Prostate Health No current issues reported. Last PSA check approximately 2 years ago. -Check PSA with today's blood work.         Mila Merry, MD  [facility] [phone] (phone) [phone] (fax)  [facility]

## 2024-02-15 ENCOUNTER — Telehealth: Payer: Self-pay | Admitting: Family Medicine

## 2024-02-15 NOTE — Telephone Encounter (Signed)
 Sending form back for signature stating when his physical was.    Please sign and call patient when ready  [phone]

## 2024-04-12 ENCOUNTER — Telehealth: Payer: Self-pay

## 2024-04-12 NOTE — Telephone Encounter (Signed)
 Patietn advised he is up to date on Tdap   Copied from CRM [id number]. Topic: Clinical - Medical Advice >> Apr 12, 2024 12:35 PM Matthew Giles wrote: Reason for CRM:  Ref TDAP  Pt is expecting a grandchild and is wanting to know when his last TDAP shot was and if he needs to have one now. Please call pt and advise.

## 2024-09-29 ENCOUNTER — Ambulatory Visit: Admitting: Family Medicine

## 2024-09-29 VITALS — BP 130/80 | HR 82 | Resp 16 | Ht 68.0 in | Wt 222.3 lb

## 2024-09-29 DIAGNOSIS — M25462 Effusion, left knee: Secondary | ICD-10-CM

## 2024-09-29 DIAGNOSIS — M25562 Pain in left knee: Secondary | ICD-10-CM | POA: Diagnosis not present

## 2024-09-29 MED ORDER — RABEPRAZOLE SODIUM 20 MG PO TBEC: 20.0000 mg | DELAYED_RELEASE_TABLET | Freq: Every day | ORAL | 1 refills | Status: DC

## 2024-09-29 NOTE — Progress Notes (Signed)
 Established patient visit   Patient: Matthew Giles   DOB: [birth year]   53 y.o. Male  MRN: [id number] Visit Date: 09/29/2024  Today's healthcare provider: Nancyann Perry, MD   Chief Complaint  Patient presents with   Knee Problem    Fluid, left knee Frequency: since July   Subjective    Discussed the use of AI scribe software for clinical note transcription with the patient, who gave verbal consent to proceed.  History of Present Illness   KIVON APREA is a 53 year old male who presents with persistent left knee swelling and pain.  He has experienced persistent swelling in his knee since at least July, which became noticeable before a beach trip in August. The swelling is constant, though its size varies, often increasing after standing on concrete floors all day. He reports that the swelling in his knee is not as bad after taking a day off work.  He experiences pain when lying down and while sitting, particularly when his leg hangs over a chair. 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 No similar symptoms are noted in other joints. Driving requires him to adjust his leg position to alleviate discomfort, and he experiences throbbing pain after sitting for a few minutes. He has not used over-the-counter medications for the knee pain, only occasionally taking Excedrin for headaches and Nexium for acid reflux.  He also reports episodes of sharp pain while eating, occurring once or twice a week, which causes him to lose his breath and sometimes cough. This is not related to specific foods, and he has been trying to chew his food more thoroughly. He takes over-the-counter Nexium regularly for acid reflux.       Medications: Outpatient Medications Prior to Visit   Medication Sig   Ascorbic Acid (VITAMIN C ADULT GUMMIES PO) Take 250 mg by mouth daily.   Multiple Vitamins-Minerals (MEGA MULTIVITAMIN FOR MEN) TABS Take by mouth daily.   mometasone  (ELOCON ) 0.1 % cream Apply 1 Application topically daily as needed (Rash).   No facility-administered medications prior to visit.   Review of Systems  Constitutional:  Negative for appetite change, chills and fever.  Respiratory:  Negative for chest tightness, shortness of breath and wheezing.   Cardiovascular:  Negative for chest pain and palpitations.  Gastrointestinal:  Negative for abdominal pain, nausea and vomiting.       Objective    BP 130/80 (BP Location: Left Arm, Patient Position: Sitting, Cuff Size: Large)   Pulse 82   Resp 16   Ht 5' 8 (1.727 m)   Wt 222 lb 4.8 oz (100.8 kg)   SpO2 99%   BMI 33.80 kg/m   Physical Exam   General appearance: Mildly obese male, cooperative and in no acute distress Head: Normocephalic, without obvious abnormality, atraumatic Respiratory: Respirations even and unlabored, normal respiratory rate Extremities: Small effusion left lateral and medial knee. No erythema. Stable knee joint. Mild joint line tenderness.  Skin: Skin color, texture, turgor normal. No rashes seen  Psych: Appropriate mood and affect. Neurologic: Mental status: Alert, oriented to person, place, and time, thought content appropriate.    Assessment & Plan       Chronic right knee swelling and pain Persistent swelling and pain with instability, exacerbated  by standing. Differential includes ligament injury, gout, pseudogout, or arthritis. Brace increased pain. - Referred to orthopedist for evaluation and possible aspiration of knee fluid. - Advised against using knee brace due to increased pain.  Dysphagia with episodic chest pain during eating Intermittent dysphagia and sharp chest pain during eating.  - Ordered swallow study to evaluate swallowing function. - Referred to  gastroenterologist for possible upper endoscopy. - Prescribed Dexilant for potential reflux-related symptoms.  Gastroesophageal reflux disease (GERD) GERD managed with Nexium, considering stronger medication due to potential tooth decay from acid exposure. - Prescribed Aciphex for GERD management. - Consider swallow study and/or GI referral if no significant improvement with Aciphex.     Nancyann Perry, MD  [facility] [phone] (phone) [phone] (fax)  [facility]

## 2024-10-22 ENCOUNTER — Other Ambulatory Visit: Payer: Self-pay | Admitting: Family Medicine

## 2025-01-29 ENCOUNTER — Encounter: Payer: Self-pay | Admitting: Family Medicine
